# Patient Record
Sex: Female | Born: 1945 | Race: White | Hispanic: No | Marital: Married | Smoking: Never smoker
Health system: Southern US, Community
[De-identification: ages and names within clinical notes are randomized; demographics above are authoritative.]

## PROBLEM LIST (undated history)

## (undated) DIAGNOSIS — K5140 Inflammatory polyps of colon without complications: Secondary | ICD-10-CM

## (undated) DIAGNOSIS — C801 Malignant (primary) neoplasm, unspecified: Secondary | ICD-10-CM

## (undated) DIAGNOSIS — T7840XA Allergy, unspecified, initial encounter: Secondary | ICD-10-CM

## (undated) DIAGNOSIS — K5792 Diverticulitis of intestine, part unspecified, without perforation or abscess without bleeding: Secondary | ICD-10-CM

## (undated) DIAGNOSIS — M1990 Unspecified osteoarthritis, unspecified site: Secondary | ICD-10-CM

## (undated) DIAGNOSIS — R011 Cardiac murmur, unspecified: Secondary | ICD-10-CM

## (undated) DIAGNOSIS — K219 Gastro-esophageal reflux disease without esophagitis: Secondary | ICD-10-CM

## (undated) DIAGNOSIS — E785 Hyperlipidemia, unspecified: Secondary | ICD-10-CM

## (undated) HISTORY — DX: Hyperlipidemia, unspecified: E78.5

## (undated) HISTORY — DX: Gastro-esophageal reflux disease without esophagitis: K21.9

## (undated) HISTORY — DX: Unspecified osteoarthritis, unspecified site: M19.90

## (undated) HISTORY — DX: Inflammatory polyps of colon without complications: K51.40

## (undated) HISTORY — DX: Allergy, unspecified, initial encounter: T78.40XA

## (undated) HISTORY — DX: Cardiac murmur, unspecified: R01.1

## (undated) HISTORY — DX: Malignant (primary) neoplasm, unspecified: C80.1

## (undated) HISTORY — DX: Diverticulitis of intestine, part unspecified, without perforation or abscess without bleeding: K57.92

---

## 2015-09-23 LAB — HM MAMMOGRAPHY

## 2019-03-21 ENCOUNTER — Telehealth: Payer: Self-pay

## 2019-03-21 NOTE — Telephone Encounter (Signed)
Questions for Screening COVID-19  Symptom onset: n/a  Travel or Contacts:no  During this illness, did/does the patient experience any of the following symptoms? Fever >100.62F []   Yes [x]   No []   Unknown Subjective fever (felt feverish) []   Yes [x]   No []   Unknown Chills []   Yes [x]   No []   Unknown Muscle aches (myalgia) []   Yes [x]   No []   Unknown Runny nose (rhinorrhea) []   Yes [x]   No []   Unknown Sore throat []   Yes [x]   No []   Unknown Cough (new onset or worsening of chronic cough) []   Yes [x]   No []   Unknown Shortness of breath (dyspnea) []   Yes [x]   No []   Unknown Nausea or vomiting []   Yes [x]   No []   Unknown Headache []   Yes [x]   No []   Unknown Abdominal pain  []   Yes [x]   No []   Unknown Diarrhea (?3 loose/looser than normal stools/24hr period) []   Yes [x]   No []   Unknown Other, specify:  Patient risk factors: Smoker? []   Current []   Former []   Never If female, currently pregnant? []   Yes []   No  There are no active problems to display for this patient.   Plan:  []   High risk for COVID-19 with red flags go to ED (with CP, SOB, weak/lightheaded, or fever > 101.5). Call ahead.  []   High risk for COVID-19 but stable. Inform provider and coordinate time for [facility] visit.   []   No red flags but URI signs or symptoms okay for [facility] visit.

## 2019-03-22 ENCOUNTER — Ambulatory Visit (INDEPENDENT_AMBULATORY_CARE_PROVIDER_SITE_OTHER): Payer: Medicare Other | Admitting: Family Medicine

## 2019-03-22 ENCOUNTER — Encounter: Payer: Self-pay | Admitting: Family Medicine

## 2019-03-22 VITALS — BP 118/78 | HR 68 | Temp 98.0°F | Ht 63.5 in | Wt 149.4 lb

## 2019-03-22 DIAGNOSIS — R311 Benign essential microscopic hematuria: Secondary | ICD-10-CM | POA: Diagnosis not present

## 2019-03-22 DIAGNOSIS — Z1239 Encounter for other screening for malignant neoplasm of breast: Secondary | ICD-10-CM

## 2019-03-22 DIAGNOSIS — E7800 Pure hypercholesterolemia, unspecified: Secondary | ICD-10-CM

## 2019-03-22 DIAGNOSIS — Z7689 Persons encountering health services in other specified circumstances: Secondary | ICD-10-CM | POA: Diagnosis not present

## 2019-03-22 DIAGNOSIS — K219 Gastro-esophageal reflux disease without esophagitis: Secondary | ICD-10-CM | POA: Diagnosis not present

## 2019-03-22 DIAGNOSIS — Z1211 Encounter for screening for malignant neoplasm of colon: Secondary | ICD-10-CM

## 2019-03-22 NOTE — Progress Notes (Signed)
Sonya Benitez is a 73 y.o. female  Chief Complaint  Patient presents with  . Establish Care    est care/having some acid reflux and bloating (pt has hiatal hernia) pt states she can't even really drink water without it acting up    HPI: Sonya Benitez is a 73 y.o. female here to establish care with our office. She is married and her husband is patient of Dr. Zigmund Daniel. She has 2 grown daughters and 2 grandchildren. She moved to area from [place] from 3 years ago.   Pt notes a h/o hiatal hernia and reflux x years but complains of increased heartburn/reflux symptoms and associated bloating x 6-7 mo. Pt states sometimes even drinking water will trigger reflux. No dysphagia.  She took nexium 20mg  daily x 2 week and then another 2 weeks. She states this helped with her heartburn but not with the increased pressure/blaoting. She recently started "GI stability with Prebiotic 2"-FL" with each meal and feels this has helped with reflux more than nexium. She denies diarrhea or constipation.  She is due for colonoscopy.   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 She is established with derm here in [place].   Pt notes a h/o "blunted heart rate response" and "low filling volume" on tilt table test in 2016. Recommendation was to consider medication adjustment or pacemaker for HR and salt or florinef.   Specialists: Urology Lyda Perone, NP - [facility]), cardio (in [place], [place])  Last CPE, labs: 2016   Last PAP: n/a Last mammo: 5 years  Last colonoscopy: 2015 - due in 2020 Last eye exam: 2020  Diet: vegetarian   Past Medical History:  Diagnosis Date  . Allergy   . Arthritis   . Cancer ([place])    skin  . Diverticulitis   . GERD (gastroesophageal reflux disease)   . Heart murmur   . Hyperlipidemia   . 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 No pertinent surgical history.  Social History   Socioeconomic History  . Marital status: Married     Spouse name: Not on file  . Number of children: Not on file  . Years of education: Not on file  . Highest education level: Not on file  Occupational History  . Not on file  Social Needs  . Financial resource strain: Not on file  . Food insecurity    Worry: Not on file    Inability: Not on file  . Transportation needs    Medical: Not on file    Non-medical: Not on file  Tobacco Use  . Smoking status: Never Smoker  . Smokeless tobacco: Never Used  Substance and Sexual Activity  . Alcohol use: Never    Frequency: Never  . Drug use: Never  . Sexual activity: Not on file  Lifestyle  . Physical activity    Days per week: Not on file    Minutes per session: Not on file  . Stress: Not on file  Relationships  . Social Herbalist on phone: Not on file    Gets together: Not on file    Attends religious service: Not on file    Active member of club or organization: Not on file    Attends meetings of clubs or organizations: Not on file    Relationship status: Not on file  . Intimate partner violence    Fear of current or ex partner: Not on file  Emotionally abused: Not on file    Physically abused: Not on file    Forced sexual activity: Not on file  Other Topics Concern  . Not on file  Social History Narrative  . Not on file    Family History  Problem Relation Age of Onset  . Arthritis Mother   . Diabetes Mother   . Heart disease Mother   . Arthritis Father   . Hearing loss Father   . Heart disease Father   . Hyperlipidemia Father       There is no immunization history on file for this patient.  Outpatient Encounter Medications as of 03/22/2019  Medication Sig  . Alpha-D-Galactosidase (BEANO ULTRA 800 PO) Take by mouth.  Marland Kitchen azithromycin (ZITHROMAX) 250 MG tablet Take by mouth daily.  . cephALEXin (KEFLEX) 500 MG capsule TK 1 C PO BID  . esomeprazole (NEXIUM) 20 MG capsule Take 20 mg by mouth daily at 12 noon.  . methylPREDNISolone (MEDROL DOSEPAK) 4 MG  TBPK tablet FPD  . NON FORMULARY Estradiol/progesteron/testosterone Drops 150mg /3gm/50mg /50ml drop 1 drop 2 x daily  . UNABLE TO FIND DGL ultra  . UNABLE TO FIND Slippery elm  . UNABLE TO FIND Slippery Elm   No facility-administered encounter medications on file as of 03/22/2019.      ROS: Gen: no fever, chills  Skin: no rash, itching ENT: no ear pain, ear drainage, nasal congestion, rhinorrhea, sinus pressure, sore throat Eyes: no blurry vision, double vision Resp: no cough, wheeze,SOB CV: no CP, palpitations, LE edema,  GI: as above in HPI GU: no dysuria, urgency, frequency, hematuria  MSK: no joint pain, myalgias, back pain Neuro: no dizziness, headache, weakness, vertigo Psych: no depression, anxiety, insomnia   Allergies  Allergen Reactions  . Sudafed Pe Sinus Cong Day-Nght  [Diphenhydramine-Phenylephrine] Palpitations    BP 118/78   Pulse 68   Temp 98 F (36.7 C) (Oral)   Ht 5' 3.5" (1.613 m)   Wt 149 lb 6.4 oz (67.8 kg)   SpO2 97%   BMI 26.05 kg/m   Physical Exam  Constitutional: She is oriented to person, place, and time. She appears well-developed and well-nourished. No distress.  HENT:  Head: Normocephalic and atraumatic.  Right Ear: Tympanic membrane and ear canal normal.  Left Ear: Tympanic membrane and ear canal normal.  Nose: Nose normal.  Mouth/Throat: Oropharynx is clear and moist and mucous membranes are normal.  Eyes: Pupils are equal, round, and reactive to light. Conjunctivae are normal.  Neck: Neck supple. No thyromegaly present.  Cardiovascular: Normal rate, regular rhythm, normal heart sounds and intact distal pulses.  No murmur heard. Pulmonary/Chest: Effort normal and breath sounds normal. No respiratory distress. She has no wheezes. She has no rhonchi.  Abdominal: Soft. Bowel sounds are normal. She exhibits no distension and no mass. There is no abdominal tenderness.  Musculoskeletal:        General: No edema.  Lymphadenopathy:    She  has no cervical adenopathy.  Neurological: She is alert and oriented to person, place, and time. She exhibits normal muscle tone. Coordination normal.  Skin: Skin is warm and dry.  Psychiatric: She has a normal mood and affect. Her behavior is normal.     A/P:  1. Encounter to establish care with new doctor - pt will schedule CPE, fasting labs  2. Benign microscopic hematuria - worked up in the past, negative w/u  3. Hypercholesterolemia - not on meds - will check FLP at CPE appt  4.  Gastroesophageal reflux disease, esophagitis presence not specified - chronic, ongoing x years - pt also with hiatal hernia - symptoms worse in past 6-37mo - Ambulatory referral to Gastroenterology  5. Screening for breast cancer 6. Screening for colon cancer - MM DIGITAL SCREENING BILATERAL; Future

## 2019-04-04 ENCOUNTER — Ambulatory Visit: Payer: Medicare Other | Admitting: Family Medicine

## 2019-04-05 ENCOUNTER — Telehealth: Payer: Self-pay | Admitting: Family Medicine

## 2019-04-05 NOTE — Telephone Encounter (Signed)

## 2019-04-06 ENCOUNTER — Ambulatory Visit: Payer: Medicare Other | Admitting: Family Medicine

## 2019-04-11 ENCOUNTER — Encounter: Payer: Self-pay | Admitting: Gastroenterology

## 2019-04-11 ENCOUNTER — Other Ambulatory Visit: Payer: Self-pay

## 2019-04-11 ENCOUNTER — Telehealth (INDEPENDENT_AMBULATORY_CARE_PROVIDER_SITE_OTHER): Payer: Medicare Other | Admitting: Gastroenterology

## 2019-04-11 VITALS — Ht 63.5 in | Wt 148.0 lb

## 2019-04-11 DIAGNOSIS — K449 Diaphragmatic hernia without obstruction or gangrene: Secondary | ICD-10-CM

## 2019-04-11 DIAGNOSIS — K219 Gastro-esophageal reflux disease without esophagitis: Secondary | ICD-10-CM

## 2019-04-11 DIAGNOSIS — R109 Unspecified abdominal pain: Secondary | ICD-10-CM

## 2019-04-11 DIAGNOSIS — R140 Abdominal distension (gaseous): Secondary | ICD-10-CM | POA: Diagnosis not present

## 2019-04-11 DIAGNOSIS — K5730 Diverticulosis of large intestine without perforation or abscess without bleeding: Secondary | ICD-10-CM

## 2019-04-11 DIAGNOSIS — Z86010 Personal history of colonic polyps: Secondary | ICD-10-CM

## 2019-04-11 NOTE — Patient Instructions (Signed)
If you are age 72 or older, your body mass index should be between 23-30. Your Body mass index is 25.81 kg/m. If this is out of the aforementioned range listed, please consider follow up with your Primary Care Provider.  If you are age 60 or younger, your body mass index should be between 19-25. Your Body mass index is 25.81 kg/m. If this is out of the aformentioned range listed, please consider follow up with your Primary Care Provider.   To help prevent the possible spread of infection to our patients, communities, and staff; we will be implementing the following measures:  As of now we are not allowing any visitors/family members to accompany you to any upcoming appointments with [facility]. If you have any concerns about this please contact our office to discuss prior to the appointment.   We have sent the following medications to your pharmacy for you to pick up at your convenience: [place] have been scheduled for an endoscopy and colonoscopy. Please follow the written instructions given to you at your visit today. Please pick up your prep supplies at the pharmacy within the next 1-3 days. If you use inhalers (even only as needed), please bring them with you on the day of your procedure. Your physician has requested that you go to www.startemmi.com and enter the access code given to you at your visit today. This web site gives a general overview about your procedure. However, you should still follow specific instructions given to you by our office regarding your preparation for the procedure.  It was a pleasure to see you today!  Vito Cirigliano, D.O.

## 2019-04-11 NOTE — Progress Notes (Signed)
Chief Complaint: GERD, Colon polyps, Abdominal bloating  Referring Provider:     Ronnald Nian, DO   HPI:    Due to current restrictions/limitations of in-office visits due to the COVID-19 pandemic, this scheduled clinic appointment was converted to a telehealth virtual consultation using D[employer]im[employer].  -Time: 24 minutes -The patient did consent to this virtual visit and is aware of possible charges through their insurance for this visit.  -Names of all parties present: Sonya Benitez (patient), Gerrit Heck, DO, [facility] (physician) -Patient location: Home -Physician location: Office  Sonya Benitez is a 73 y.o. female referred to the Gastroenterology Clinic for evaluation of GERD and known history of hiatal hernia.  Has had reflux for years, characterized as heartburn and regurgitation, treated with multiple acid suppression meds in the past (Protonix, Nexium, Prilosec, Tums), which she eventually stopped due to variable efficacy along with not wanting to take PPI long-term. Had controlled with dietary mods and OTC/supplements/herbals for 5 years, but with breakthrough in the last 6-7 months.  No dysphagia.  No early satiety, weight loss, fever, chills.  No hematochezia or melena.  Has additionally noted abdominal bloating/abdominal pressure for the last 6 to 7 months.  She recently trialed a course of Nexium 20 mg/day x4 weeks, with some improvement in her heartburn and no change in abdominal pressure/bloating.  Has since started a prebiotic with each meal and feel this has helped the reflux more than the Nexium did and also vastly improved her abdominal pressure/bloating. Will also take [facility] for prn use, with quick relief of reflux sxs.   Has also had Abx courses x3 this year which has affected her UGI sxs, in particular bloating.   Has a known hx of HH diagnosed by her previous GI in [place], [place], with last EGD years ago. No reports available for review.     Last colonoscopy was 5 years ago and has a hx of diverticulosis and polyps, with recommendation to repeat in 5 years for ongoing surveillance.  She is otherwise without LGI symptoms.  No recent changes in bowel habits.  Past medical history, past surgical history, social history, family history, medications, and allergies reviewed in the chart and with patient.    Past Medical History:  Diagnosis Date  . Allergy   . Arthritis   . Cancer ([place])    skin  . Diverticulitis   . GERD (gastroesophageal reflux disease)   . Heart murmur   . Hyperlipidemia   . 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 No pertinent surgical history. Family History  Problem Relation Age of Onset  . Arthritis Mother   . Diabetes Mother   . Heart disease Mother   . Arthritis Father   . Hearing loss Father   . Heart disease Father   . Hyperlipidemia Father   . Colon cancer Neg Hx    Social History   Tobacco Use  . Smoking status: Never Smoker  . Smokeless tobacco: Never Used  Substance Use Topics  . Alcohol use: Never    Frequency: Never  . Drug use: Never   Current Outpatient Medications  Medication Sig Dispense Refill  . Alpha-D-Galactosidase (BEANO ULTRA 800 PO) Take by mouth as needed.     . NON FORMULARY Estradiol/progesteron/testosterone Drops 150mg /3gm/50mg /62ml drop 1 drop 2 x daily    . Probiotic Product (PROBIOTIC PO) Take by mouth daily.    Marland Kitchen UNABLE  TO FIND as needed. DGL ultra     . UNABLE TO FIND as needed. Slippery elm      No current facility-administered medications for this visit.    Allergies  Allergen Reactions  . Sudafed Pe Sinus Cong Day-Nght  [Diphenhydramine-Phenylephrine] Palpitations     Review of Systems: All systems reviewed and negative except where noted in HPI.     Physical Exam:    Complete physical exam not completed due to the nature of this telehealth communication.   Gen: Awake, alert, and oriented, and well communicative. HEENT:  EOMI, non-icteric sclera, NCAT, MMM Neck: Normal movement of head and neck Pulm: No labored breathing, speaking in full sentences without conversational dyspnea Derm: No apparent lesions or bruising in visible field MS: Moves all visible extremities without noticeable abnormality Psych: Pleasant, cooperative, normal speech, thought processing seemingly intact   ASSESSMENT AND PLAN;   1) GERD 2) Hiatal hernia -Recent breakthrough and index reflux symptoms  - Evaluate for erosive esophagitis, size of hiatal hernia, LES laxity, etc. time of EGD -Resume antireflux lifestyle/dietary modifications  3) Abdominal bloating 4) Abdominal pressure - EGD to evaluate for mucosal/luminal etiology -Gastric and duodenal biopsies -Suspect some element of SIBO related to her 3 courses of antibiotics earlier this year.  Symptoms improving with trial of prebiotic, therefore holding off on adding trial of Xifaxan or other enteric antibiotics.  If symptoms not fully resolved, can consider breath testing  5) History of colon polyps -Last colonoscopy was >5 years ago and notable for polyps per patient, with recommendation to repeat in 5 years for ongoing surveillance.  No records available for review from last GI and [place]  6) History of diverticulosis  The indications, risks, and benefits of EGD and colonoscopy were explained to the patient in detail. Risks include but are not limited to bleeding, perforation, adverse reaction to medications, and cardiopulmonary compromise. Sequelae include but are not limited to the possibility of surgery, hositalization, and mortality. The patient verbalized understanding and wished to proceed. All questions answered, referred to scheduler and bowel prep ordered. Further recommendations pending results of the exam.     Lavena Bullion, DO, FACG  04/11/2019, 9:04 AM   Ronnald Nian, DO

## 2019-04-12 ENCOUNTER — Other Ambulatory Visit: Payer: Self-pay

## 2019-04-12 DIAGNOSIS — K5730 Diverticulosis of large intestine without perforation or abscess without bleeding: Secondary | ICD-10-CM

## 2019-04-12 DIAGNOSIS — R140 Abdominal distension (gaseous): Secondary | ICD-10-CM

## 2019-04-12 DIAGNOSIS — R109 Unspecified abdominal pain: Secondary | ICD-10-CM

## 2019-04-12 DIAGNOSIS — K219 Gastro-esophageal reflux disease without esophagitis: Secondary | ICD-10-CM

## 2019-04-12 DIAGNOSIS — Z86010 Personal history of colonic polyps: Secondary | ICD-10-CM

## 2019-04-12 DIAGNOSIS — K449 Diaphragmatic hernia without obstruction or gangrene: Secondary | ICD-10-CM

## 2019-04-12 MED ORDER — NA SULFATE-K SULFATE-MG SULF 17.5-3.13-1.6 GM/177ML PO SOLN: 1.0000 | Freq: Once | ORAL | 0 refills | Status: AC

## 2019-04-17 ENCOUNTER — Telehealth: Payer: Self-pay | Admitting: Gastroenterology

## 2019-04-17 NOTE — Telephone Encounter (Signed)
Left message to call back to ask Covid-19 screening questions. Covid-19 Screening Questions:   Do you now or have you had a fever in the last 14 days? no   Do you have any respiratory symptoms of shortness of breath or cough now or in the last 14 days? no   Do you have any family members or close contacts with diagnosed  or suspected Covid-19 in the past 14 days? no   Have you been tested for Covid-19 and found to be positive? no   Pt made aware of that care partner may wait in the car or come up to the lobby during the procedure but will need to provide their own mask.

## 2019-04-18 ENCOUNTER — Ambulatory Visit (AMBULATORY_SURGERY_CENTER): Payer: Medicare Other | Admitting: Gastroenterology

## 2019-04-18 ENCOUNTER — Other Ambulatory Visit: Payer: Self-pay

## 2019-04-18 ENCOUNTER — Encounter: Payer: Self-pay | Admitting: Gastroenterology

## 2019-04-18 VITALS — BP 110/60 | HR 66 | Temp 97.7°F | Resp 14 | Ht 63.5 in | Wt 148.0 lb

## 2019-04-18 DIAGNOSIS — K317 Polyp of stomach and duodenum: Secondary | ICD-10-CM | POA: Diagnosis not present

## 2019-04-18 DIAGNOSIS — K635 Polyp of colon: Secondary | ICD-10-CM

## 2019-04-18 DIAGNOSIS — D122 Benign neoplasm of ascending colon: Secondary | ICD-10-CM

## 2019-04-18 DIAGNOSIS — K219 Gastro-esophageal reflux disease without esophagitis: Secondary | ICD-10-CM

## 2019-04-18 DIAGNOSIS — Z86010 Personal history of colonic polyps: Secondary | ICD-10-CM

## 2019-04-18 DIAGNOSIS — R140 Abdominal distension (gaseous): Secondary | ICD-10-CM

## 2019-04-18 DIAGNOSIS — R109 Unspecified abdominal pain: Secondary | ICD-10-CM

## 2019-04-18 DIAGNOSIS — K2970 Gastritis, unspecified, without bleeding: Secondary | ICD-10-CM | POA: Diagnosis not present

## 2019-04-18 DIAGNOSIS — D120 Benign neoplasm of cecum: Secondary | ICD-10-CM

## 2019-04-18 DIAGNOSIS — K640 First degree hemorrhoids: Secondary | ICD-10-CM

## 2019-04-18 DIAGNOSIS — K5730 Diverticulosis of large intestine without perforation or abscess without bleeding: Secondary | ICD-10-CM

## 2019-04-18 MED ORDER — SODIUM CHLORIDE 0.9 % IV SOLN: 500.0000 mL | Freq: Once | INTRAVENOUS | Status: DC

## 2019-04-18 NOTE — Op Note (Signed)
[place] Patient Name: Sonya Benitez Procedure Date: 04/18/2019 7:47 AM MRN: [id number] Endoscopist: Gerrit Heck , MD Age: 73 Referring MD:  Date of Birth: 07-27-1946 Gender: Female Account #: [id number] Procedure:                Upper GI endoscopy Indications:              Suspected esophageal reflux, Exclusion of reflux                            esophagitis, Abdominal bloating                           73 yo female with a long-standing history of                            reflux, with recent breakthrough in index symptoms                            of heartburn and regurgitation, along with                            abdominal bloating and upper abdominal                            discomfort/pressure. Medicines:                Monitored Anesthesia Care Procedure:                Pre-Anesthesia Assessment:                           - Prior to the procedure, a History and Physical                            was performed, and patient medications and                            allergies were reviewed. The patient's tolerance of                            previous anesthesia was also reviewed. The risks                            and benefits of the procedure and the sedation                            options and risks were discussed with the patient.                            All questions were answered, and informed consent                            was obtained. Prior Anticoagulants: The patient has  taken no previous anticoagulant or antiplatelet                            agents. ASA Grade Assessment: II - A patient with                            mild systemic disease. After reviewing the risks                            and benefits, the patient was deemed in                            satisfactory condition to undergo the procedure.                           After obtaining informed consent, the endoscope was                             passed under direct vision. Throughout the                            procedure, the patient's blood pressure, pulse, and                            oxygen saturations were monitored continuously. The                            Endoscope was introduced through the mouth, and                            advanced to the second part of duodenum. The upper                            GI endoscopy was accomplished without difficulty.                            The patient tolerated the procedure well. Scope In: Scope Out: Findings:                 The examined esophagus was normal.                           The Z-line was regular and was found 36 cm from the                            incisors.                           Esophagogastric landmarks were identified: the                            Z-line was found at 36 cm, the gastroesophageal                            junction was found  at 36 cm and the site of hiatal                            narrowing was found at 36 cm from the incisors.                           The gastroesophageal flap valve was visualized                            endoscopically and classified as Hill Grade I                            (prominent fold, tight to endoscope).                           A few small sessile polyps with no bleeding and no                            stigmata of recent bleeding were found in the                            gastric fundus and in the gastric body. These                            polyps were removed with a cold biopsy forceps.                            Resection and retrieval were complete. Estimated                            blood loss was minimal.                           Normal mucosa was found in the entire examined                            stomach. Biopsies were taken with a cold forceps                            for Helicobacter pylori testing. Estimated blood                            loss was minimal.                            The duodenal bulb, first portion of the duodenum                            and second portion of the duodenum were normal.                            Biopsies for histology were taken with a cold  forceps for evaluation of celiac disease. Estimated                            blood loss was minimal. Complications:            No immediate complications. Estimated Blood Loss:     Estimated blood loss was minimal. Impression:               - Normal esophagus.                           - Z-line regular, 36 cm from the incisors.                           - Esophagogastric landmarks identified.                           - Gastroesophageal flap valve classified as Hill                            Grade I (prominent fold, tight to endoscope).                           - A few gastric polyps. Resected and retrieved.                           - Normal mucosa was found in the entire stomach.                            Biopsied.                           - Normal duodenal bulb, first portion of the                            duodenum and second portion of the duodenum.                            Biopsied. Recommendation:           - Patient has a contact number available for                            emergencies. The signs and symptoms of potential                            delayed complications were discussed with the                            patient. Return to normal activities tomorrow.                            Written discharge instructions were provided to the                            patient.                           -  Resume previous diet.                           - Continue present medications.                           - Await pathology results.                           - Return to GI clinic PRN.                           - If ongoing symptoms of bloating, may consider                            hydrogen breath testing vs empiric trial of                             antimicrobial therapy for possible                            antibiotic-induced Small Intestinal Bacterial                            Overgrowth (SIBO). Gerrit Heck, MD 04/18/2019 8:25:45 AM

## 2019-04-18 NOTE — Progress Notes (Signed)
Report to PACU, RN, vss, BBS= Clear.  

## 2019-04-18 NOTE — Progress Notes (Signed)
Temp taken by JB VS taken by MO

## 2019-04-18 NOTE — Op Note (Signed)
[place] Patient Name: Sonya Benitez Procedure Date: 04/18/2019 7:46 AM MRN: [id number] Endoscopist: Gerrit Heck , MD Age: 73 Referring MD:  Date of Birth: 27-Aug-1946 Gender: Female Account #: [id number] Procedure:                Colonoscopy Indications:              Surveillance: Personal history of colonic polyps                            (unknown histology) on last colonoscopy 5 years ago                            performed at outside facility, with recommendation                            to repeat in 5 years for ongoing surveillance.                            Otherwise, no active lower GI symptoms. Medicines:                Monitored Anesthesia Care Procedure:                Pre-Anesthesia Assessment:                           - Prior to the procedure, a History and Physical                            was performed, and patient medications and                            allergies were reviewed. The patient's tolerance of                            previous anesthesia was also reviewed. The risks                            and benefits of the procedure and the sedation                            options and risks were discussed with the patient.                            All questions were answered, and informed consent                            was obtained. Prior Anticoagulants: The patient has                            taken no previous anticoagulant or antiplatelet                            agents. ASA Grade Assessment: II - A patient with  mild systemic disease. After reviewing the risks                            and benefits, the patient was deemed in                            satisfactory condition to undergo the procedure.                           After obtaining informed consent, the colonoscope                            was passed under direct vision. Throughout the                            procedure, the  patient's blood pressure, pulse, and                            oxygen saturations were monitored continuously. The                            Colonoscope was introduced through the anus and                            advanced to the the cecum, identified by                            appendiceal orifice and ileocecal valve. The                            colonoscopy was performed without difficulty. The                            patient tolerated the procedure well. The quality                            of the bowel preparation was adequate. The                            ileocecal valve, appendiceal orifice, and rectum                            were photographed. Scope In: 8:01:58 AM Scope Out: 8:17:08 AM Scope Withdrawal Time: 0 hours 13 minutes 2 seconds  Total Procedure Duration: 0 hours 15 minutes 10 seconds  Findings:                 Skin tags were found on perianal exam.                           Two sessile polyps were found in the ascending                            colon and cecum. The polyps were 6 to 8 mm in size.  These polyps were removed with a cold snare.                            Resection and retrieval were complete. Estimated                            blood loss was minimal.                           Multiple small and large-mouthed diverticula were                            found in the sigmoid colon, descending colon,                            transverse colon and ascending colon.                           Non-bleeding internal hemorrhoids were found during                            retroflexion. The hemorrhoids were small. Complications:            No immediate complications. Estimated Blood Loss:     Estimated blood loss was minimal. Impression:               - Perianal skin tags found on perianal exam.                           - Two 6 to 8 mm polyps in the ascending colon and                            in the cecum, removed  with a cold snare. Resected                            and retrieved.                           - Diverticulosis in the sigmoid colon, in the                            descending colon, in the transverse colon and in                            the ascending colon.                           - Non-bleeding internal hemorrhoids. Recommendation:           - Patient has a contact number available for                            emergencies. The signs and symptoms of potential                            delayed complications were  discussed with the                            patient. Return to normal activities tomorrow.                            Written discharge instructions were provided to the                            patient.                           - Resume previous diet.                           - Continue present medications.                           - Await pathology results.                           - Repeat colonoscopy in 5 years for surveillance                            based on pathology results.                           - Return to GI clinic PRN.                           - Use fiber, for example Citrucel, Fibercon, Konsyl                            or Metamucil.                           - Internal hemorrhoids were noted on this study and                            may be amenable to hemorrhoid band ligation. If you                            are interested in further treatment of these                            hemorrhoids with band ligation, please contact my                            clinic to set up an appointment for evaluation and                            treatment. Gerrit Heck, MD 04/18/2019 8:30:34 AM

## 2019-04-18 NOTE — Patient Instructions (Signed)
Please read handouts provided. Await pathology results. Return to GI clinic as needed. Use fiber, for example Citrucel, Fibercon, Konsyl or Metamucil. Continue present medications.       YOU HAD AN ENDOSCOPIC PROCEDURE TODAY AT [facility]:   Refer to the procedure report that was given to you for any specific questions about what was found during the examination.  If the procedure report does not answer your questions, please call your gastroenterologist to clarify.  If you requested that your care partner not be given the details of your procedure findings, then the procedure report has been included in a sealed envelope for you to review at your convenience later.  YOU SHOULD EXPECT: Some feelings of bloating in the abdomen. Passage of more gas than usual.  Walking can help get rid of the air that was put into your GI tract during the procedure and reduce the bloating. If you had a lower endoscopy (such as a colonoscopy or flexible sigmoidoscopy) you may notice spotting of blood in your stool or on the toilet paper. If you underwent a bowel prep for your procedure, you may not have a normal bowel movement for a few days.  Please Note:  You might notice some irritation and congestion in your nose or some drainage.  This is from the oxygen used during your procedure.  There is no need for concern and it should clear up in a day or so.  SYMPTOMS TO REPORT IMMEDIATELY:   Following lower endoscopy (colonoscopy or flexible sigmoidoscopy):  Excessive amounts of blood in the stool  Significant tenderness or worsening of abdominal pains  Swelling of the abdomen that is new, acute  Fever of 100F or higher   Following upper endoscopy (EGD)  Vomiting of blood or coffee ground material  New chest pain or pain under the shoulder blades  Painful or persistently difficult swallowing  New shortness of breath  Fever of 100F or higher  Black, tarry-looking stools  For urgent or  emergent issues, a gastroenterologist can be reached at any hour by calling [phone].   DIET:  We do recommend a small meal at first, but then you may proceed to your regular diet.  Drink plenty of fluids but you should avoid alcoholic beverages for 24 hours.  ACTIVITY:  You should plan to take it easy for the rest of today and you should NOT DRIVE or use heavy machinery until tomorrow (because of the sedation medicines used during the test).    FOLLOW UP: Our staff will call the number listed on your records 48-72 hours following your procedure to check on you and address any questions or concerns that you may have regarding the information given to you following your procedure. If we do not reach you, we will leave a message.  We will attempt to reach you two times.  During this call, we will ask if you have developed any symptoms of COVID 19. If you develop any symptoms (ie: fever, flu-like symptoms, shortness of breath, cough etc.) before then, please call [phone].  If you test positive for Covid 19 in the 2 weeks post procedure, please call and report this information to [place].    If any biopsies were taken you will be contacted by phone or by letter within the next 1-3 weeks.  Please call us at [phone] if you have not heard about the biopsies in 3 weeks.    SIGNATURES/CONFIDENTIALITY: You and/or your care partner have signed paperwork  which will be entered into your electronic medical record.  These signatures attest to the fact that that the information above on your After Visit Summary has been reviewed and is understood.  Full responsibility of the confidentiality of this discharge information lies with you and/or your care-partner.

## 2019-04-19 ENCOUNTER — Telehealth: Payer: Self-pay

## 2019-04-19 NOTE — Telephone Encounter (Signed)
Questions for Screening COVID-19  Symptom onset: n/a  Travel or Contacts: no  During this illness, did/does the patient experience any of the following symptoms? Fever >100.36F []   Yes [x]   No []   Unknown Subjective fever (felt feverish) []   Yes [x]   No []   Unknown Chills []   Yes [x]   No []   Unknown Muscle aches (myalgia) []   Yes [x]   No []   Unknown Runny nose (rhinorrhea) []   Yes [x]   No []   Unknown Sore throat []   Yes [x]   No []   Unknown Cough (new onset or worsening of chronic cough) []   Yes [x]   No []   Unknown Shortness of breath (dyspnea) []   Yes [x]   No []   Unknown Nausea or vomiting []   Yes [x]   No []   Unknown Headache []   Yes [x]   No []   Unknown Abdominal pain  []   Yes [x]   No []   Unknown Diarrhea (?3 loose/looser than normal stools/24hr period) []   Yes [x]   No []   Unknown Other, specify:  Patient risk factors: Smoker? []   Current []   Former []   Never If female, currently pregnant? []   Yes []   No  Patient Active Problem List   Diagnosis Date Noted  . Benign microscopic hematuria 03/22/2019    Plan:  []   High risk for COVID-19 with red flags go to ED (with CP, SOB, weak/lightheaded, or fever > 101.5). Call ahead.  []   High risk for COVID-19 but stable. Inform provider and coordinate time for [facility] visit.   []   No red flags but URI signs or symptoms okay for [facility] visit.

## 2019-04-20 ENCOUNTER — Telehealth: Payer: Self-pay

## 2019-04-20 ENCOUNTER — Ambulatory Visit (INDEPENDENT_AMBULATORY_CARE_PROVIDER_SITE_OTHER): Payer: Medicare Other | Admitting: Family Medicine

## 2019-04-20 ENCOUNTER — Encounter: Payer: Self-pay | Admitting: Family Medicine

## 2019-04-20 VITALS — BP 122/82 | HR 74 | Temp 97.9°F | Ht 63.5 in | Wt 148.0 lb

## 2019-04-20 DIAGNOSIS — Z85828 Personal history of other malignant neoplasm of skin: Secondary | ICD-10-CM

## 2019-04-20 DIAGNOSIS — Z0000 Encounter for general adult medical examination without abnormal findings: Secondary | ICD-10-CM | POA: Diagnosis not present

## 2019-04-20 DIAGNOSIS — Z9889 Other specified postprocedural states: Secondary | ICD-10-CM

## 2019-04-20 DIAGNOSIS — G479 Sleep disorder, unspecified: Secondary | ICD-10-CM | POA: Diagnosis not present

## 2019-04-20 DIAGNOSIS — Z2821 Immunization not carried out because of patient refusal: Secondary | ICD-10-CM

## 2019-04-20 LAB — URINALYSIS, ROUTINE W REFLEX MICROSCOPIC
Bilirubin Urine: NEGATIVE
Ketones, ur: NEGATIVE
Leukocytes,Ua: NEGATIVE
Nitrite: NEGATIVE
Specific Gravity, Urine: <=1.005 — AB (ref 1.000–1.030)
Total Protein, Urine: NEGATIVE
Urine Glucose: NEGATIVE
Urobilinogen, UA: 0.2 (ref 0.0–1.0)
WBC, UA: NONE SEEN (ref 0–?)
pH: 6 (ref 5.0–8.0)

## 2019-04-20 LAB — BASIC METABOLIC PANEL
BUN: 9 mg/dL (ref 6–23)
CO2: 27 mEq/L (ref 19–32)
Calcium: 9.2 mg/dL (ref 8.4–10.5)
Chloride: 102 mEq/L (ref 96–112)
Creatinine, Ser: 0.7 mg/dL (ref 0.40–1.20)
GFR: 82.13 mL/min (ref >60.00)
Glucose, Bld: 102 mg/dL — ABNORMAL HIGH (ref 70–99)
Potassium: 3.6 mEq/L (ref 3.5–5.1)
Sodium: 138 mEq/L (ref 135–145)

## 2019-04-20 LAB — LIPID PANEL
Cholesterol: 242 mg/dL — ABNORMAL HIGH (ref 0–200)
HDL: 49 mg/dL (ref >39.00)
LDL Cholesterol: 164 mg/dL — ABNORMAL HIGH (ref 0–99)
NonHDL: 192.64
Total CHOL/HDL Ratio: 5
Triglycerides: 143 mg/dL (ref 0.0–149.0)
VLDL: 28.6 mg/dL (ref 0.0–40.0)

## 2019-04-20 LAB — AST: AST: 18 U/L (ref 0–37)

## 2019-04-20 LAB — ALT: ALT: 11 U/L (ref 0–35)

## 2019-04-20 NOTE — Telephone Encounter (Signed)
  Follow up Call-  Call back number 04/18/2019  Post procedure Call Back phone  # [phone]  Permission to leave phone message Yes     Patient questions:  Do you have a fever, pain , or abdominal swelling? No. Pain Score  0 *  Have you tolerated food without any problems? Yes.    Have you been able to return to your normal activities? Yes.    Do you have any questions about your discharge instructions: Diet   No. Medications  No. Follow up visit  No.  Do you have questions or concerns about your Care? No.  Actions: * If pain score is 4 or above: No action needed, pain <4.  1. Have you developed a fever since your procedure? no  2.   Have you had an respiratory symptoms (SOB or cough) since your procedure? no  3.   Have you tested positive for COVID 19 since your procedure no  4.   Have you had any family members/close contacts diagnosed with the COVID 19 since your procedure?  no   If yes to any of these questions please route to Joylene John, RN and Alphonsa Gin, Therapist, sports.

## 2019-04-20 NOTE — Patient Instructions (Addendum)
Melatonin 5mg  30-25min prior to bedtime x 4-6 wks Can also try long acting melatonin   Zoster Vaccine, Recombinant injection What is this medicine? ZOSTER VACCINE (ZOS ter vak SEEN) is used to prevent shingles in adults 72 years old and over. This vaccine is not used to treat shingles or nerve pain from shingles. This medicine may be used for other purposes; ask your health care provider or pharmacist if you have questions. COMMON BRAND NAME(S): [facility] What should I tell my health care provider before I take this medicine? They need to know if you have any of these conditions:  blood disorders or disease  cancer like leukemia or lymphoma  immune system problems or therapy  an unusual or allergic reaction to vaccines, other medications, foods, dyes, or preservatives  pregnant or trying to get pregnant  breast-feeding How should I use this medicine? This vaccine is for injection in a muscle. It is given by a health care professional. Talk to your pediatrician regarding the use of this medicine in children. This medicine is not approved for use in children. Overdosage: If you think you have taken too much of this medicine contact a poison control center or emergency room at once. NOTE: This medicine is only for you. Do not share this medicine with others. What if I miss a dose? Keep appointments for follow-up (booster) doses as directed. It is important not to miss your dose. Call your doctor or health care professional if you are unable to keep an appointment. What may interact with this medicine?  medicines that suppress your immune system  medicines to treat cancer  steroid medicines like prednisone or cortisone This list may not describe all possible interactions. Give your health care provider a list of all the medicines, herbs, non-prescription drugs, or dietary supplements you use. Also tell them if you smoke, drink alcohol, or use illegal drugs. Some items may interact with  your medicine. What should I watch for while using this medicine? Visit your doctor for regular check ups. This vaccine, like all vaccines, may not fully protect everyone. What side effects may I notice from receiving this medicine? Side effects that you should report to your doctor or health care professional as soon as possible:  allergic reactions like skin rash, itching or hives, swelling of the face, lips, or tongue  breathing problems Side effects that usually do not require medical attention (report these to your doctor or health care professional if they continue or are bothersome):  chills  headache  fever  nausea, vomiting  redness, warmth, pain, swelling or itching at site where injected  tiredness This list may not describe all possible side effects. Call your doctor for medical advice about side effects. You may report side effects to FDA at 1-800-FDA-1088. Where should I keep my medicine? This vaccine is only given in a clinic, pharmacy, doctor's office, or other health care setting and will not be stored at home. NOTE: This sheet is a summary. It may not cover all possible information. If you have questions about this medicine, talk to your doctor, pharmacist, or health care provider.  2020 Elsevier/Gold Standard (2017-04-11 13:20:30)

## 2019-04-20 NOTE — Telephone Encounter (Signed)
Attempted to reach patient for post-procedure f/u call. No answer. Unable to leave message. Staff will make another atttempt to reach her again later today.

## 2019-04-20 NOTE — Progress Notes (Addendum)
Sonya Benitez is a 73 y.o. female  Chief Complaint  Patient presents with  . Annual Exam    CPE- Fasting/ denies TDAP/ doesn't want immunizations/ dexa scan 6 years?    HPI: Sonya Benitez is a 73 y.o. female here for derm referral, concern about sleep, general physical, fasting labs today. She has mammo scheduled, she is UTD on colonoscopy/EGD, declines Dexa (last done 5-6 years ago and normal). She declines Tdap, pneumonia vaccine. She will consider shingrix vaccine.   She is having trouble sleeping, some times falling asleep and other times staying asleep. She has not tried any OTC or Rx meds for this. She does have some nights where she sleeps relatively well. Very limited caffeine. Denies anxiety.   Pt would like to establish with new derm. She has h/o basal and squamous cell.  Last mammo: scheduled Last Dexa: declines Last colonoscopy: 04/2019  Diet/Exercise: vegetarian, eats cheese, drinks plenty of water  Med refills needed today? none  Past Medical History:  Diagnosis Date  . Allergy   . Arthritis   . Cancer ([place])    skin  . Diverticulitis   . GERD (gastroesophageal reflux disease)   . Heart murmur   . Hyperlipidemia   . 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 No pertinent surgical history.  Social History   Socioeconomic History  . Marital status: Married    Spouse name: Not on file  . Number of children: Not on file  . Years of education: Not on file  . Highest education level: Not on file  Occupational History  . Not on file  Social Needs  . Financial resource strain: Not on file  . Food insecurity    Worry: Not on file    Inability: Not on file  . Transportation needs    Medical: Not on file    Non-medical: Not on file  Tobacco Use  . Smoking status: Never Smoker  . Smokeless tobacco: Never Used  Substance and Sexual Activity  . Alcohol use: Never    Frequency: Never  . Drug use: Never  . Sexual activity: Not on file   Lifestyle  . Physical activity    Days per week: Not on file    Minutes per session: Not on file  . Stress: Not on file  Relationships  . Social Herbalist on phone: Not on file    Gets together: Not on file    Attends religious service: Not on file    Active member of club or organization: Not on file    Attends meetings of clubs or organizations: Not on file    Relationship status: Not on file  . Intimate partner violence    Fear of current or ex partner: Not on file    Emotionally abused: Not on file    Physically abused: Not on file    Forced sexual activity: Not on file  Other Topics Concern  . Not on file  Social History Narrative  . Not on file    Family History  Problem Relation Age of Onset  . Arthritis Mother   . Diabetes Mother   . Heart disease Mother   . Arthritis Father   . Hearing loss Father   . Heart disease Father   . Hyperlipidemia Father   . Colon cancer Neg Hx   . Stomach cancer Neg Hx   . Rectal cancer Neg Hx   . Esophageal cancer Neg Hx  There is no immunization history on file for this patient.  Outpatient Encounter Medications as of 04/20/2019  Medication Sig  . Alpha-D-Galactosidase (BEANO ULTRA 800 PO) Take by mouth as needed.   . NON FORMULARY Estradiol/progesteron/testosterone Drops 150mg /3gm/50mg /16ml drop 1 drop 2 x daily  . Probiotic Product (PROBIOTIC PO) Take by mouth daily.  . psyllium (METAMUCIL) 58.6 % packet Take 1 packet by mouth daily.  Marland Kitchen UNABLE TO FIND as needed. DGL ultra   . UNABLE TO FIND as needed. Slippery elm    No facility-administered encounter medications on file as of 04/20/2019.      ROS: Gen: no fever, chills  Skin: no rash, itching ENT: no ear pain, ear drainage, nasal congestion, rhinorrhea, sinus pressure, sore throat Eyes: no blurry vision, double vision Resp: no cough, wheeze,SOB Breast: no breast tenderness, no nipple discharge, no breast masses CV: no CP, palpitations, LE edema,   GI: + heartburn, no n/v/d/c, abd pain' + bloating GU: no dysuria, urgency, frequency, hematuria; no vaginal itching, odor, discharge MSK: occasional joint pains, mostly in knees, no myalgias, back pain Neuro: no dizziness, headache, weakness, vertigo Psych: no depression, anxiety; + trouble sleeping   Allergies  Allergen Reactions  . Sudafed Pe Sinus Cong Day-Nght  [Diphenhydramine-Phenylephrine] Palpitations    BP 122/82   Pulse 74   Temp 97.9 F (36.6 C) (Oral)   Ht 5' 3.5" (1.613 m)   Wt 148 lb (67.1 kg)   SpO2 98%   BMI 25.81 kg/m   Physical Exam  Constitutional: She is oriented to person, place, and time. She appears well-developed and well-nourished. No distress.  HENT:  Head: Normocephalic and atraumatic.  Right Ear: Tympanic membrane and ear canal normal.  Left Ear: Tympanic membrane and ear canal normal.  Nose: Nose normal.  Mouth/Throat: Oropharynx is clear and moist and mucous membranes are normal.  Eyes: Pupils are equal, round, and reactive to light. Conjunctivae are normal.  Neck: Neck supple. No thyromegaly present.  Cardiovascular: Normal rate, regular rhythm and intact distal pulses.  Pulmonary/Chest: Effort normal and breath sounds normal. No respiratory distress. She has no wheezes. She has no rhonchi.  Abdominal: Soft. Bowel sounds are normal. She exhibits no distension and no mass. There is abdominal tenderness (epigastric TTP).  Musculoskeletal:        General: No edema.  Lymphadenopathy:    She has no cervical adenopathy.  Neurological: She is alert and oriented to person, place, and time. She exhibits normal muscle tone. Coordination normal.  Skin: Skin is warm and dry.  Psychiatric: She has a normal mood and affect. Her behavior is normal.     A/P:  1. Annual physical exam - mammo scheduled, colonoscopy UTD, declines Dexa - declines Tdap, PCV 23; will consider shingrix and make RN appt if she would like to receive it - discussed importance of  regular CV exercise, healthy diet, adequate sleep - ALT - AST - Basic metabolic panel - Lipid panel - Urinalysis - next CPE in 1 year  2. H/O basal cell carcinoma excision - Ambulatory referral to Dermatology  3. Pneumococcal vaccination declined by patient  4. Tetanus, diphtheria, and acellular pertussis (Tdap) vaccination declined  5. Sleep disturbance - pt will try melatonin 3-5mg  qHS x 2-4 wks - reviewed sleep hygiene, handout given

## 2019-04-24 ENCOUNTER — Telehealth: Payer: Self-pay | Admitting: Gastroenterology

## 2019-04-24 ENCOUNTER — Encounter: Payer: Self-pay | Admitting: Gastroenterology

## 2019-04-24 NOTE — Telephone Encounter (Signed)
Pathology from recent upper endoscopy and colonoscopy reviewed.  Path letter mailed out to the patient with the following:  The polyps removed from your colon was benign, but precancerous, Sessile Serrated Polyps. This means that it had the potential to change into cancer over time had it not been removed.  I recommend you have a repeat colonoscopy in 5 years to determine if you have developed any new polyps and to screen for colorectal cancer.  The biopsies taken from your upper endoscopy were as follows: - The biopsies taken from your stomach were notable for gastritis (inflammation), but there was no evidence of Helicobacter pylori infection.  - The polyps resected from your stomach were benign fundic gland polyps.  There was no evidence of infection with Helicobacter pylori or intestinal metaplasia/dysplasia.  These types of polyps are typically secondary to acid suppression therapy and no specific follow-up required for these small, benign polyps. - The biopsies taken from your small intestine were normal and there was no evidence of Celiac Disease.

## 2019-04-24 NOTE — Telephone Encounter (Signed)
Please review resulted patho results and advise

## 2019-04-25 NOTE — Telephone Encounter (Signed)
This was meant for Dr. Gerrit Heck and will forward to him. Thanks.

## 2019-04-25 NOTE — Telephone Encounter (Addendum)
Called and spoke with patient-patient informed of result note and MD recommendations; patient is agreeable with plan of care; Patient verbalized understanding of information/instructions;  Patient was advised to call the office at [phone] if questions/concerns arise; Recall already in Epic;

## 2019-04-26 ENCOUNTER — Telehealth: Payer: Self-pay

## 2019-04-26 NOTE — Telephone Encounter (Signed)
Copied from [place] [id number]. Topic: General - Call Back - No Documentation >> Apr 26, 2019  3:40 PM Erick Blinks wrote: Best contact: [phone]  Reason for CRM: Pt is requesting call back from Nurse, she has information so the office can retrieve old records.

## 2019-04-27 NOTE — Telephone Encounter (Signed)
Spoke with pt. Pt gave me information to get recent mammogram and colonoscopy. Sent fax to retrieve this information

## 2019-05-04 ENCOUNTER — Encounter: Payer: Self-pay | Admitting: Family Medicine

## 2019-05-04 NOTE — Addendum Note (Signed)
Addended by: Ronnald Nian on: 05/04/2019 07:51 AM   Modules accepted: Level of Service

## 2019-05-09 ENCOUNTER — Ambulatory Visit
Admission: RE | Admit: 2019-05-09 | Discharge: 2019-05-09 | Disposition: A | Discharge status: Home / Self Care | Payer: Medicare Other | Source: Ambulatory Visit | Attending: Family Medicine | Admitting: Family Medicine

## 2019-05-09 ENCOUNTER — Other Ambulatory Visit: Payer: Self-pay

## 2019-05-09 DIAGNOSIS — Z1239 Encounter for other screening for malignant neoplasm of breast: Secondary | ICD-10-CM

## 2019-05-11 ENCOUNTER — Ambulatory Visit: Payer: Medicare Other

## 2019-05-17 ENCOUNTER — Other Ambulatory Visit: Payer: Self-pay | Admitting: Family Medicine

## 2019-05-17 DIAGNOSIS — R928 Other abnormal and inconclusive findings on diagnostic imaging of breast: Secondary | ICD-10-CM

## 2019-05-21 ENCOUNTER — Encounter: Payer: Self-pay | Admitting: Family Medicine

## 2019-06-01 ENCOUNTER — Other Ambulatory Visit: Payer: Self-pay | Admitting: Family Medicine

## 2019-06-01 ENCOUNTER — Ambulatory Visit
Admission: RE | Admit: 2019-06-01 | Discharge: 2019-06-01 | Disposition: A | Discharge status: Home / Self Care | Payer: Medicare Other | Source: Ambulatory Visit | Attending: Family Medicine | Admitting: Family Medicine

## 2019-06-01 ENCOUNTER — Other Ambulatory Visit: Payer: Self-pay

## 2019-06-01 DIAGNOSIS — N6320 Unspecified lump in the left breast, unspecified quadrant: Secondary | ICD-10-CM

## 2019-06-01 DIAGNOSIS — R928 Other abnormal and inconclusive findings on diagnostic imaging of breast: Secondary | ICD-10-CM

## 2019-06-04 ENCOUNTER — Ambulatory Visit
Admission: RE | Admit: 2019-06-04 | Discharge: 2019-06-04 | Disposition: A | Discharge status: Home / Self Care | Payer: Medicare Other | Source: Ambulatory Visit | Attending: Family Medicine | Admitting: Family Medicine

## 2019-06-04 ENCOUNTER — Other Ambulatory Visit (HOSPITAL_COMMUNITY): Payer: Self-pay | Admitting: Diagnostic Radiology

## 2019-06-04 ENCOUNTER — Other Ambulatory Visit: Payer: Self-pay

## 2019-06-04 DIAGNOSIS — N6320 Unspecified lump in the left breast, unspecified quadrant: Secondary | ICD-10-CM

## 2019-06-06 ENCOUNTER — Other Ambulatory Visit: Payer: Self-pay

## 2019-06-06 ENCOUNTER — Encounter: Payer: Self-pay | Admitting: Family Medicine

## 2019-06-06 ENCOUNTER — Telehealth (INDEPENDENT_AMBULATORY_CARE_PROVIDER_SITE_OTHER): Payer: Medicare Other | Admitting: Family Medicine

## 2019-06-06 ENCOUNTER — Telehealth: Payer: Self-pay | Admitting: Family Medicine

## 2019-06-06 DIAGNOSIS — N6320 Unspecified lump in the left breast, unspecified quadrant: Secondary | ICD-10-CM | POA: Diagnosis not present

## 2019-06-06 DIAGNOSIS — Z712 Person consulting for explanation of examination or test findings: Secondary | ICD-10-CM | POA: Diagnosis not present

## 2019-06-06 DIAGNOSIS — Z9889 Other specified postprocedural states: Secondary | ICD-10-CM | POA: Diagnosis not present

## 2019-06-06 DIAGNOSIS — R928 Other abnormal and inconclusive findings on diagnostic imaging of breast: Secondary | ICD-10-CM | POA: Diagnosis not present

## 2019-06-06 NOTE — Telephone Encounter (Signed)
Yes

## 2019-06-06 NOTE — Telephone Encounter (Signed)
Pt is coming in to get Tdap tomorrow. Please advise, is this okey?

## 2019-06-06 NOTE — Progress Notes (Signed)
Virtual Visit via Video Note  I connected with Sonya Benitez on 06/06/19 at  1:30 PM EDT by a video enabled telemedicine application and verified that I am speaking with the correct person using two identifiers. Location patient: home Location provider: home office Persons participating in the virtual visit: patient, provider  I discussed the limitations of evaluation and management by telemedicine and the availability of in person appointments. The patient expressed understanding and agreed to proceed.  Chief Complaint  Patient presents with  . discuss biopsy    HPI: Sonya Benitez is a 73 y.o. female who recently underwent biopsy of her left breast after abnormalities were found on mammogram. Lt breast US and biopsy were done on 06/04/19. Pathology showed: - FIBROCYSTIC CHANGES WITH FEATURES OF RUPTURE - THERE IS NO EVIDENCE OF MALIGNANCY  She was also found to have "probably benign calcifications" in the Rt breast and short term 73mo f/u for these with a diagnostic mammo was recommended.   Past Medical History:  Diagnosis Date  . Allergy   . Arthritis   . Cancer ([place])    skin  . Diverticulitis   . GERD (gastroesophageal reflux disease)   . Heart murmur   . Hyperlipidemia   . 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 No pertinent surgical history.  Family History  Problem Relation Age of Onset  . Arthritis Mother   . Diabetes Mother   . Heart disease Mother   . Arthritis Father   . Hearing loss Father   . Heart disease Father   . Hyperlipidemia Father   . Colon cancer Neg Hx   . Stomach cancer Neg Hx   . Rectal cancer Neg Hx   . Esophageal cancer Neg Hx     Social History   Tobacco Use  . Smoking status: Never Smoker  . Smokeless tobacco: Never Used  Substance Use Topics  . Alcohol use: Never    Frequency: Never  . Drug use: Never     Current Outpatient Medications:  .  Alpha-D-Galactosidase (BEANO ULTRA 800 PO), Take by mouth as  needed. , Disp: , Rfl:  .  NON FORMULARY, Estradiol/progesteron/testosterone Drops 150mg /3gm/50mg /42ml drop 1 drop 2 x daily, Disp: , Rfl:  .  psyllium (METAMUCIL) 58.6 % packet, Take 1 packet by mouth daily., Disp: , Rfl:  .  UNABLE TO FIND, as needed. DGL ultra , Disp: , Rfl:  .  UNABLE TO FIND, as needed. Slippery elm , Disp: , Rfl:   Allergies  Allergen Reactions  . Sudafed Pe Sinus Cong Day-Nght  [Diphenhydramine-Phenylephrine] Palpitations      ROS: See pertinent positives and negatives per HPI.   EXAM:  VITALS per patient if applicable: There were no vitals taken for this visit.   GENERAL: alert, oriented, appears well and in no acute distress  NECK: normal movements of the head and neck  LUNGS: on inspection no signs of respiratory distress, breathing rate appears normal, no obvious gross SOB, gasping or wheezing, no conversational dyspnea  CV: no obvious cyanosis  MS: moves all visible extremities without noticeable abnormality  PSYCH/NEURO: pleasant and cooperative, speech and thought processing grossly intact   ASSESSMENT AND PLAN:  1. Encounter to discuss test results 2. S/P left breast biopsy 3. Abnormal mammogram 4. Left breast mass - reviewed results with pt who already received a call this AM from breast center at [facility] - she is relieved that biopsy was negative for malignancy but is  going to f/u to have benign areas removed as recommended - Ambulatory referral to General Surgery   I discussed the assessment and treatment plan with the patient. The patient was provided an opportunity to ask questions and all were answered. The patient agreed with the plan and demonstrated an understanding of the instructions.   The patient was advised to call back or seek an in-person evaluation if the symptoms worsen or if the condition fails to improve as anticipated.   Letta Median, DO

## 2019-06-07 ENCOUNTER — Ambulatory Visit: Payer: Medicare Other

## 2019-06-07 HISTORY — PX: BREAST BIOPSY: SHX20

## 2019-06-07 NOTE — Telephone Encounter (Signed)
Pt has medicare (medicare do not pay for Tdap in office), she going to get it at the pharmacy.

## 2019-06-17 ENCOUNTER — Other Ambulatory Visit: Payer: Self-pay | Admitting: General Surgery

## 2019-06-17 DIAGNOSIS — R928 Other abnormal and inconclusive findings on diagnostic imaging of breast: Secondary | ICD-10-CM

## 2019-06-21 ENCOUNTER — Other Ambulatory Visit: Payer: Self-pay | Admitting: General Surgery

## 2019-06-21 DIAGNOSIS — R928 Other abnormal and inconclusive findings on diagnostic imaging of breast: Secondary | ICD-10-CM

## 2019-06-26 ENCOUNTER — Encounter: Payer: Self-pay | Admitting: Family Medicine

## 2019-06-27 ENCOUNTER — Other Ambulatory Visit: Payer: Self-pay

## 2019-06-27 ENCOUNTER — Encounter (HOSPITAL_BASED_OUTPATIENT_CLINIC_OR_DEPARTMENT_OTHER): Payer: Self-pay | Admitting: *Deleted

## 2019-06-27 NOTE — Progress Notes (Signed)
      Enhanced Recovery after Surgery  Enhanced Recovery after Surgery is a protocol used to improve the stress on your body and your recovery after surgery.  Patient Instructions  . The night before surgery:  o No food after midnight. ONLY clear liquids after midnight  . The day of surgery (if you do NOT have diabetes):  o Drink ONE (1) Pre-Surgery Clear Ensure as directed.   o This drink was given to you during your hospital  pre-op appointment visit. o The pre-op nurse will instruct you on the time to drink the  Pre-Surgery Ensure depending on your surgery time. o Finish the drink at the designated time by the pre-op nurse.  o Nothing else to drink after completing the  Pre-Surgery Clear Ensure.  . The day of surgery (if you have diabetes): o Drink ONE (1) Gatorade 2 (G2) as directed. o This drink was given to you during your hospital  pre-op appointment visit.  o The pre-op nurse will instruct you on the time to drink the   Gatorade 2 (G2) depending on your surgery time. o Color of the Gatorade may vary. Red is not allowed. o Nothing else to drink after completing the  Gatorade 2 (G2).         If you have questions, please contact your surgeon's office.  Surgical soap also given to patient with instructions for use.  Patient verbalized understanding of instructions. 

## 2019-06-29 ENCOUNTER — Inpatient Hospital Stay (HOSPITAL_COMMUNITY)
Admission: RE | Admit: 2019-06-29 | Discharge: 2019-06-29 | Disposition: A | Discharge status: Home / Self Care | Payer: Medicare Other | Source: Ambulatory Visit

## 2019-06-29 NOTE — H&P (Signed)
Sonya Benitez Appointment: 06/18/2019 11:30 AM Location: [facility] Patient #: [id number] DOB: November 17, 1945 Married / Language: Sonya Benitez / Race: White Female   History of Present Illness Sonya Klein MD; 06/18/2019 12:30 PM) The patient is a 73 year old female who presents with a complaint of Breast problems.Pt is a 73 yo F who is referred for an abnormal mammogram. Diagnostic imaging was performed which appeared to make the right side appear more likely to be benign, but the left side appeared more suspicious. She had asymmetry and some calcs in the "lateral retroareolar right breast." The left side had two small masses in the retroareolar region. One was more oval and 8 mm, and the other was more elongated and around 7-8 mm. Core needle biopsy was performed and showed fibrocystic changes. This was felt to be discordant and she is referred for excisional biopsy.   She had menarche at age 72, and is G91P2. She did breastfeed for 7-12 months total. She has three half siblings that have had cancer. These are on her father's side. Her father's family had no other cancer history, and the half siblings' mother's side of the family had many cancers, so that is reassuring to her. Her half siblings had breast cancer, kidney cancer, and stomach cancer (heavy smoker).    dx mammogram/us 06/01/2019 COMPARISON: Current screening study, 05/09/2019 and prior exam, 09/23/2015. No other prior studies.  ACR Breast Density Category b: There are scattered areas of fibroglandular density.  FINDINGS: On the right, the area of possible asymmetry appears as superimposed fibroglandular tissue on the diagnostic tomosynthesis images. There are several calcifications, few punctate, 3 rod-like in 1 coarse, in the lateral retroareolar breast, loosely grouped with no convincing linearity and no branching. There are no suspicious masses and no areas of architectural distortion.  On the left, there  are 2 adjacent small masses on the diagnostic tomosynthesis images, in the retroareolar left breast, the larger measuring 7 mm in long axis, oval and mostly circumscribed. There are no other masses, no areas of architectural distortion and no suspicious calcifications.  Mammographic images were processed with CAD.  On physical exam, no mass is palpated in the retroareolar regions of either breast.  Targeted right breast ultrasound is performed, showing heterogeneous tissue in the retroareolar right breast, but no mass or suspicious lesion. No dilated ducts. Ultrasound of the right axilla shows no enlarged or abnormal lymph nodes.  Targeted left breast ultrasound is performed, showing an oval mass with mildly irregular and partly ill-defined margins, hypoechoic, possibly cystic, in the 1 o'clock retroareolar left breast, with an adjacent more elongated but otherwise similar appearing smaller mass. The oval mass measures 8 x 6 x 7 mm. The more elongated mass measures 8 x 3 x 7 mm. Ultrasound of the left axilla shows no enlarged or abnormal lymph nodes.  IMPRESSION: 1. Two adjacent indeterminate masses in the retroareolar left breast, the larger measuring 8 x 6 x 7 mm. Tissue sampling of this mass is recommended. 2. Probably benign calcifications in the retroareolar right breast.  RECOMMENDATION: 1. Ultrasound-guided core needle biopsy of the larger of the 2 adjacent retroareolar left breast masses. 2. If pathology for the left breast mass is benign, then short-term follow-up for right breast calcifications with diagnostic mammography in 6 months would be recommended. If pathology demonstrates malignancy, stereotactic core needle biopsy of the right breast calcifications would be indicated.  I have discussed the findings and recommendations with the patient. If applicable, a reminder letter will  be sent to the patient regarding the next appointment.  BI-RADS CATEGORY 4:  Suspicious.   pathology left breast biopsy 06/04/2019 Diagnosis Breast, left, needle core biopsy, 1 o'clock ribbon clip - FIBROCYSTIC CHANGES WITH FEATURES OF RUPTURE. - THERE IS NO EVIDENCE OF MALIGNANCY.   Past Surgical History Sonya Klein, MD; 06/18/2019 12:28 PM) Breast Biopsy  Left. Colon Polyp Removal - Colonoscopy  Oral Surgery   Diagnostic Studies History Sonya Klein, MD; 06/18/2019 12:28 PM) Mammogram  within last year  Allergies (Sonya Benitez, Sonya Benitez; 06/18/2019 12:07 PM) No Known Drug Allergies  [06/18/2019]: Allergies Reconciled   Medication History (Sonya Benitez, CMA; 06/18/2019 12:08 PM) Estradiol-Testosterone (2-50MG /ML Oil, Intramuscular) Active. (30 ML) Medications Reconciled  Social History Sonya Klein, MD; 06/18/2019 12:28 PM) No alcohol use  No caffeine use  No drug use  Tobacco use  Never smoker.  Family History Sonya Klein, MD; 06/18/2019 12:28 PM) Arthritis  Father. Diabetes Mellitus  Mother. Heart Disease  Father. Heart disease in female family member before age 72  Migraine Headache  Daughter, Mother.  Pregnancy / Birth History Sonya Klein, MD; 06/18/2019 12:28 PM) Age at menarche  80 years. Gravida  2 Maternal age  70-25 Para  2  Other Problems Sonya Klein, MD; 06/18/2019 12:28 PM) Atrial Fibrillation  Cholelithiasis  Diverticulosis  Gastroesophageal Reflux Disease  Hemorrhoids  Hepatitis  Hypercholesterolemia  Migraine Headache     Review of Systems Sonya Klein MD; 06/18/2019 12:28 PM) HEENT Present- Wears glasses/contact lenses. Not Present- Earache, Hearing Loss, Hoarseness, Nose Bleed, Oral Ulcers, Ringing in the Ears, Seasonal Allergies, Sinus Pain, Sore Throat, Visual Disturbances and Yellow Eyes. Cardiovascular Present- Leg Cramps. Not Present- Chest Pain, Difficulty Breathing Lying Down, Palpitations, Rapid Heart Rate, Shortness of Breath and Swelling of Extremities. Female Genitourinary Not  Present- Frequency, Nocturia, Painful Urination, Pelvic Pain and Urgency. Psychiatric Not Present- Anxiety, Bipolar, Change in Sleep Pattern, Depression, Fearful and Frequent crying. Hematology Present- Easy Bruising. Not Present- Blood Thinners, Excessive bleeding, Gland problems, HIV and Persistent Infections.  Vitals (Sonya Benitez CMA; 06/18/2019 12:09 PM) 06/18/2019 12:08 PM Weight: 148.6 lb Height: 63.5in Body Surface Area: 1.71 m Body Mass Index: 25.91 kg/m  Temp.: 91.48F (Oral)  Pulse: 84 (Regular)  P.OX: 94% (Room air) BP: 128/70(Sitting, Left Arm, Standard)       Physical Exam Sonya Klein MD; 06/18/2019 12:30 PM) General Mental Status-Alert. General Appearance-Consistent with stated age. Hydration-Well hydrated. Voice-Normal.  Head and Neck Head-normocephalic, atraumatic with no lesions or palpable masses. Trachea-midline. Thyroid Gland Characteristics - normal size and consistency.  Eye Eyeball - Bilateral-Extraocular movements intact. Sclera/Conjunctiva - Bilateral-No scleral icterus.  Chest and Lung Exam Chest and lung exam reveals -quiet, even and easy respiratory effort with no use of accessory muscles and on auscultation, normal breath sounds, no adventitious sounds and normal vocal resonance. Inspection Chest Wall - Normal. Back - normal.  Breast Note: relatively symmetric bilaterally. mild ptosis. no palpable masses. no skin dimpling. no nipple retraction or nipple discharge. no LAD.   Cardiovascular Cardiovascular examination reveals -normal heart sounds, regular rate and rhythm with no murmurs and normal pedal pulses bilaterally.  Abdomen Inspection Inspection of the abdomen reveals - No Hernias. Palpation/Percussion Palpation and Percussion of the abdomen reveal - Soft, Non Tender, No Rebound tenderness, No Rigidity (guarding) and No hepatosplenomegaly. Auscultation Auscultation of the abdomen reveals - Bowel  sounds normal.  Neurologic Neurologic evaluation reveals -alert and oriented x 3 with no impairment of recent or remote memory. Mental Status-Normal.  Musculoskeletal Global Assessment -  Note: no gross deformities.  Normal Exam - Left-Upper Extremity Strength Normal and Lower Extremity Strength Normal. Normal Exam - Right-Upper Extremity Strength Normal and Lower Extremity Strength Normal.  Lymphatic Head & Neck  General Head & Neck Lymphatics: Bilateral - Description - Normal. Axillary  General Axillary Region: Bilateral - Description - Normal. Tenderness - Non Tender. Femoral & Inguinal  Generalized Femoral & Inguinal Lymphatics: Bilateral - Description - No Generalized lymphadenopathy.    Assessment & Plan Sonya Klein MD; 06/18/2019 12:30 PM)  ABNORMAL MAMMOGRAM OF LEFT BREAST (R92.8) Impression: Pt will need seed localized left breast biopsy. Due to proximity, both of the left subareolar masses can be excised at the same time. The mammographers have said that if this is malignant, it would be prudent to biopsy the right breast or do closer follow up.  The surgical procedure was described to the patient. I discussed the incision type and location and that we would need radiology involved on with a wire or seed marker and/or sentinel node.  The risks and benefits of the procedure were described to the patient and she wishes to proceed.  We discussed the risks bleeding, infection, damage to other structures, need for further procedures/surgeries. We discussed the risk of seroma. The patient was advised if the area in the breast in cancer, we may need to go back to surgery for additional tissue to obtain negative margins or for a lymph node biopsy. The patient was advised that these are the most common complications, but that others can occur as well. They were advised against taking aspirin or other anti-inflammatory agents/blood thinners the week before  surgery.  Current Plans You are being scheduled for surgery- Our schedulers will call you.  You should hear from our office's scheduling department within 5 working days about the location, date, and time of surgery. We try to make accommodations for patient's preferences in scheduling surgery, but sometimes the OR schedule or the surgeon's schedule prevents [place] from making those accommodations.  If you have not heard from our office [phone]) in 5 working days, call the office and ask for your surgeon's nurse.  If you have other questions about your diagnosis, plan, or surgery, call the office and ask for your surgeon's nurse.  Pt Education - [facility] Breast Biopsy HCI: discussed with patient and provided information.   Signed by Sonya Klein, MD (06/18/2019 12:31 PM)

## 2019-06-29 NOTE — Progress Notes (Signed)
LVM regarding the pt being too early for COVID testing to reschedule.

## 2019-06-30 ENCOUNTER — Other Ambulatory Visit (HOSPITAL_COMMUNITY)
Admission: RE | Admit: 2019-06-30 | Discharge: 2019-06-30 | Disposition: A | Discharge status: Home / Self Care | Payer: Medicare Other | Source: Ambulatory Visit | Attending: General Surgery | Admitting: General Surgery

## 2019-06-30 DIAGNOSIS — Z01812 Encounter for preprocedural laboratory examination: Secondary | ICD-10-CM | POA: Diagnosis present

## 2019-06-30 DIAGNOSIS — Z20828 Contact with and (suspected) exposure to other viral communicable diseases: Secondary | ICD-10-CM | POA: Diagnosis not present

## 2019-07-01 LAB — NOVEL CORONAVIRUS, NAA (HOSP ORDER, SEND-OUT TO REF LAB; TAT 18-24 HRS): SARS-CoV-2, NAA: NOT DETECTED

## 2019-07-03 ENCOUNTER — Ambulatory Visit
Admission: RE | Admit: 2019-07-03 | Discharge: 2019-07-03 | Disposition: A | Discharge status: Home / Self Care | Payer: Medicare Other | Source: Ambulatory Visit | Attending: General Surgery | Admitting: General Surgery

## 2019-07-03 ENCOUNTER — Other Ambulatory Visit: Payer: Self-pay

## 2019-07-03 DIAGNOSIS — R928 Other abnormal and inconclusive findings on diagnostic imaging of breast: Secondary | ICD-10-CM

## 2019-07-04 ENCOUNTER — Other Ambulatory Visit: Payer: Self-pay

## 2019-07-04 ENCOUNTER — Ambulatory Visit (HOSPITAL_BASED_OUTPATIENT_CLINIC_OR_DEPARTMENT_OTHER): Payer: Medicare Other | Admitting: Anesthesiology

## 2019-07-04 ENCOUNTER — Encounter (HOSPITAL_BASED_OUTPATIENT_CLINIC_OR_DEPARTMENT_OTHER)
Admission: RE | Disposition: A | Discharge status: Home / Self Care | Payer: Self-pay | Source: Home / Self Care | Attending: General Surgery

## 2019-07-04 ENCOUNTER — Ambulatory Visit (HOSPITAL_BASED_OUTPATIENT_CLINIC_OR_DEPARTMENT_OTHER)
Admission: RE | Admit: 2019-07-04 | Discharge: 2019-07-04 | Disposition: A | Discharge status: Home / Self Care | Payer: Medicare Other | Attending: General Surgery | Admitting: General Surgery

## 2019-07-04 ENCOUNTER — Ambulatory Visit
Admission: RE | Admit: 2019-07-04 | Discharge: 2019-07-04 | Disposition: A | Discharge status: Home / Self Care | Payer: Medicare Other | Source: Ambulatory Visit | Attending: General Surgery | Admitting: General Surgery

## 2019-07-04 ENCOUNTER — Encounter (HOSPITAL_BASED_OUTPATIENT_CLINIC_OR_DEPARTMENT_OTHER): Payer: Self-pay | Admitting: *Deleted

## 2019-07-04 DIAGNOSIS — I4891 Unspecified atrial fibrillation: Secondary | ICD-10-CM | POA: Diagnosis not present

## 2019-07-04 DIAGNOSIS — N6342 Unspecified lump in left breast, subareolar: Secondary | ICD-10-CM | POA: Insufficient documentation

## 2019-07-04 DIAGNOSIS — Z8249 Family history of ischemic heart disease and other diseases of the circulatory system: Secondary | ICD-10-CM | POA: Diagnosis not present

## 2019-07-04 DIAGNOSIS — Z800 Family history of malignant neoplasm of digestive organs: Secondary | ICD-10-CM | POA: Diagnosis not present

## 2019-07-04 DIAGNOSIS — E7800 Pure hypercholesterolemia, unspecified: Secondary | ICD-10-CM | POA: Insufficient documentation

## 2019-07-04 DIAGNOSIS — Z803 Family history of malignant neoplasm of breast: Secondary | ICD-10-CM | POA: Insufficient documentation

## 2019-07-04 DIAGNOSIS — N6453 Retraction of nipple: Secondary | ICD-10-CM | POA: Diagnosis not present

## 2019-07-04 DIAGNOSIS — Z833 Family history of diabetes mellitus: Secondary | ICD-10-CM | POA: Insufficient documentation

## 2019-07-04 DIAGNOSIS — K5790 Diverticulosis of intestine, part unspecified, without perforation or abscess without bleeding: Secondary | ICD-10-CM | POA: Insufficient documentation

## 2019-07-04 DIAGNOSIS — Z8051 Family history of malignant neoplasm of kidney: Secondary | ICD-10-CM | POA: Diagnosis not present

## 2019-07-04 DIAGNOSIS — G43909 Migraine, unspecified, not intractable, without status migrainosus: Secondary | ICD-10-CM | POA: Diagnosis not present

## 2019-07-04 DIAGNOSIS — Z820 Family history of epilepsy and other diseases of the nervous system: Secondary | ICD-10-CM | POA: Insufficient documentation

## 2019-07-04 DIAGNOSIS — Z8261 Family history of arthritis: Secondary | ICD-10-CM | POA: Diagnosis not present

## 2019-07-04 DIAGNOSIS — K219 Gastro-esophageal reflux disease without esophagitis: Secondary | ICD-10-CM | POA: Diagnosis not present

## 2019-07-04 DIAGNOSIS — Z79899 Other long term (current) drug therapy: Secondary | ICD-10-CM | POA: Diagnosis not present

## 2019-07-04 DIAGNOSIS — R928 Other abnormal and inconclusive findings on diagnostic imaging of breast: Secondary | ICD-10-CM

## 2019-07-04 DIAGNOSIS — K449 Diaphragmatic hernia without obstruction or gangrene: Secondary | ICD-10-CM | POA: Diagnosis not present

## 2019-07-04 HISTORY — PX: RADIOACTIVE SEED GUIDED EXCISIONAL BREAST BIOPSY: SHX6490

## 2019-07-04 HISTORY — PX: BREAST EXCISIONAL BIOPSY: SUR124

## 2019-07-04 SURGERY — RADIOACTIVE SEED GUIDED BREAST BIOPSY
Anesthesia: General | Site: Breast | Laterality: Left

## 2019-07-04 MED ORDER — OXYCODONE HCL 5 MG PO TABS: 2.5000 mg | ORAL_TABLET | Freq: Four times a day (QID) | ORAL | 0 refills | Status: DC | PRN

## 2019-07-04 MED ORDER — BUPIVACAINE HCL (PF) 0.5 % IJ SOLN
INTRAMUSCULAR | Status: AC
  Filled 2019-07-04: qty 30

## 2019-07-04 MED ORDER — ACETAMINOPHEN 500 MG PO TABS
ORAL_TABLET | ORAL | Status: AC
  Filled 2019-07-04: qty 2

## 2019-07-04 MED ORDER — CEFAZOLIN SODIUM-DEXTROSE 2-4 GM/100ML-% IV SOLN
2.0000 g | INTRAVENOUS | Status: AC
  Administered 2019-07-04: 2 g via INTRAVENOUS

## 2019-07-04 MED ORDER — BACITRACIN ZINC 500 UNIT/GM EX OINT
TOPICAL_OINTMENT | CUTANEOUS | Status: AC
  Filled 2019-07-04: qty 28.35

## 2019-07-04 MED ORDER — SUCCINYLCHOLINE CHLORIDE 200 MG/10ML IV SOSY
PREFILLED_SYRINGE | INTRAVENOUS | Status: AC
  Filled 2019-07-04: qty 10

## 2019-07-04 MED ORDER — LIDOCAINE HCL (PF) 1 % IJ SOLN
INTRAMUSCULAR | Status: AC
  Filled 2019-07-04: qty 30

## 2019-07-04 MED ORDER — LACTATED RINGERS IV SOLN
INTRAVENOUS | Status: DC
  Administered 2019-07-04: 08:00:00 via INTRAVENOUS

## 2019-07-04 MED ORDER — EPHEDRINE 5 MG/ML INJ
INTRAVENOUS | Status: AC
  Filled 2019-07-04: qty 10

## 2019-07-04 MED ORDER — MIDAZOLAM HCL 2 MG/2ML IJ SOLN
INTRAMUSCULAR | Status: AC
  Filled 2019-07-04: qty 2

## 2019-07-04 MED ORDER — FENTANYL CITRATE (PF) 100 MCG/2ML IJ SOLN: 25.0000 ug | INTRAMUSCULAR | Status: DC | PRN

## 2019-07-04 MED ORDER — LIDOCAINE-EPINEPHRINE (PF) 1 %-1:200000 IJ SOLN
INTRAMUSCULAR | Status: DC | PRN
  Administered 2019-07-04: 30 mL

## 2019-07-04 MED ORDER — FENTANYL CITRATE (PF) 100 MCG/2ML IJ SOLN
INTRAMUSCULAR | Status: AC
  Filled 2019-07-04: qty 2

## 2019-07-04 MED ORDER — DEXAMETHASONE SODIUM PHOSPHATE 4 MG/ML IJ SOLN
INTRAMUSCULAR | Status: DC | PRN
  Administered 2019-07-04: 5 mg via INTRAVENOUS

## 2019-07-04 MED ORDER — LIDOCAINE 2% (20 MG/ML) 5 ML SYRINGE
INTRAMUSCULAR | Status: AC
  Filled 2019-07-04: qty 5

## 2019-07-04 MED ORDER — ONDANSETRON HCL 4 MG/2ML IJ SOLN
INTRAMUSCULAR | Status: DC | PRN
  Administered 2019-07-04: 4 mg via INTRAVENOUS

## 2019-07-04 MED ORDER — CHLORHEXIDINE GLUCONATE CLOTH 2 % EX PADS: 6.0000 | MEDICATED_PAD | Freq: Once | CUTANEOUS | Status: DC

## 2019-07-04 MED ORDER — LIDOCAINE HCL (CARDIAC) PF 100 MG/5ML IV SOSY
PREFILLED_SYRINGE | INTRAVENOUS | Status: DC | PRN
  Administered 2019-07-04: 50 mg via INTRAVENOUS

## 2019-07-04 MED ORDER — DEXAMETHASONE SODIUM PHOSPHATE 10 MG/ML IJ SOLN
INTRAMUSCULAR | Status: AC
  Filled 2019-07-04: qty 1

## 2019-07-04 MED ORDER — MEPERIDINE HCL 25 MG/ML IJ SOLN: 6.2500 mg | INTRAMUSCULAR | Status: DC | PRN

## 2019-07-04 MED ORDER — CEFAZOLIN SODIUM-DEXTROSE 2-4 GM/100ML-% IV SOLN
INTRAVENOUS | Status: AC
  Filled 2019-07-04: qty 100

## 2019-07-04 MED ORDER — PROPOFOL 10 MG/ML IV BOLUS
INTRAVENOUS | Status: DC | PRN
  Administered 2019-07-04: 150 mg via INTRAVENOUS

## 2019-07-04 MED ORDER — METOCLOPRAMIDE HCL 5 MG/ML IJ SOLN: 10.0000 mg | Freq: Once | INTRAMUSCULAR | Status: DC | PRN

## 2019-07-04 MED ORDER — SUCCINYLCHOLINE CHLORIDE 20 MG/ML IJ SOLN
INTRAMUSCULAR | Status: DC | PRN
  Administered 2019-07-04: 100 mg via INTRAVENOUS

## 2019-07-04 MED ORDER — ONDANSETRON HCL 4 MG/2ML IJ SOLN
INTRAMUSCULAR | Status: AC
  Filled 2019-07-04: qty 2

## 2019-07-04 MED ORDER — PHENYLEPHRINE 40 MCG/ML (10ML) SYRINGE FOR IV PUSH (FOR BLOOD PRESSURE SUPPORT)
PREFILLED_SYRINGE | INTRAVENOUS | Status: AC
  Filled 2019-07-04: qty 10

## 2019-07-04 MED ORDER — BUPIVACAINE HCL (PF) 0.25 % IJ SOLN
INTRAMUSCULAR | Status: AC
  Filled 2019-07-04: qty 90

## 2019-07-04 MED ORDER — EPHEDRINE SULFATE 50 MG/ML IJ SOLN
INTRAMUSCULAR | Status: DC | PRN
  Administered 2019-07-04: 10 mg via INTRAVENOUS

## 2019-07-04 MED ORDER — ACETAMINOPHEN 500 MG PO TABS
1000.0000 mg | ORAL_TABLET | ORAL | Status: AC
  Administered 2019-07-04: 08:00:00 1000 mg via ORAL

## 2019-07-04 MED ORDER — FENTANYL CITRATE (PF) 100 MCG/2ML IJ SOLN
INTRAMUSCULAR | Status: DC | PRN
  Administered 2019-07-04: 50 ug via INTRAVENOUS

## 2019-07-04 SURGICAL SUPPLY — 57 items
BINDER BREAST XLRG (GAUZE/BANDAGES/DRESSINGS) ×2 IMPLANT
BLADE SURG 10 STRL SS (BLADE) ×3 IMPLANT
BLADE SURG 15 STRL LF DISP TIS (BLADE) IMPLANT
BLADE SURG 15 STRL SS (BLADE)
CANISTER SUC SOCK COL 7IN (MISCELLANEOUS) IMPLANT
CANISTER SUCT 1200ML W/VALVE (MISCELLANEOUS) ×3 IMPLANT
CHLORAPREP W/TINT 26 (MISCELLANEOUS) ×3 IMPLANT
CLIP VESOCCLUDE LG 6/CT (CLIP) ×3 IMPLANT
CLOSURE WOUND 1/2 X4 (GAUZE/BANDAGES/DRESSINGS) ×1
COVER BACK TABLE REUSABLE LG (DRAPES) ×3 IMPLANT
COVER MAYO STAND REUSABLE (DRAPES) ×3 IMPLANT
COVER PROBE W GEL 5X96 (DRAPES) ×3 IMPLANT
COVER WAND RF STERILE (DRAPES) IMPLANT
DECANTER SPIKE VIAL GLASS SM (MISCELLANEOUS) IMPLANT
DERMABOND ADVANCED (GAUZE/BANDAGES/DRESSINGS) ×2
DERMABOND ADVANCED .7 DNX12 (GAUZE/BANDAGES/DRESSINGS) ×1 IMPLANT
DRAPE LAPAROSCOPIC ABDOMINAL (DRAPES) ×3 IMPLANT
DRAPE UTILITY XL STRL (DRAPES) ×3 IMPLANT
DRSG PAD ABDOMINAL 8X10 ST (GAUZE/BANDAGES/DRESSINGS) ×2 IMPLANT
ELECT COATED BLADE 2.86 ST (ELECTRODE) ×3 IMPLANT
ELECT REM PT RETURN 9FT ADLT (ELECTROSURGICAL) ×3
ELECTRODE REM PT RTRN 9FT ADLT (ELECTROSURGICAL) ×1 IMPLANT
GAUZE SPONGE 4X4 12PLY STRL LF (GAUZE/BANDAGES/DRESSINGS) ×3 IMPLANT
GLOVE BIO SURGEON STRL SZ 6 (GLOVE) ×3 IMPLANT
GLOVE BIO SURGEON STRL SZ7 (GLOVE) ×2 IMPLANT
GLOVE BIOGEL PI IND STRL 6.5 (GLOVE) ×1 IMPLANT
GLOVE BIOGEL PI INDICATOR 6.5 (GLOVE) ×2
GLOVE EXAM NITRILE MD LF STRL (GLOVE) ×2 IMPLANT
GOWN STRL REUS W/ TWL LRG LVL3 (GOWN DISPOSABLE) ×1 IMPLANT
GOWN STRL REUS W/ TWL XL LVL3 (GOWN DISPOSABLE) IMPLANT
GOWN STRL REUS W/TWL 2XL LVL3 (GOWN DISPOSABLE) ×3 IMPLANT
GOWN STRL REUS W/TWL LRG LVL3 (GOWN DISPOSABLE) ×2
GOWN STRL REUS W/TWL XL LVL3 (GOWN DISPOSABLE) ×2
KIT MARKER MARGIN INK (KITS) ×3 IMPLANT
LIGHT WAVEGUIDE WIDE FLAT (MISCELLANEOUS) IMPLANT
NDL HYPO 25X1 1.5 SAFETY (NEEDLE) ×1 IMPLANT
NEEDLE HYPO 25X1 1.5 SAFETY (NEEDLE) ×3 IMPLANT
NS IRRIG 1000ML POUR BTL (IV SOLUTION) ×3 IMPLANT
PACK BASIN DAY SURGERY FS (CUSTOM PROCEDURE TRAY) ×3 IMPLANT
PENCIL BUTTON HOLSTER BLD 10FT (ELECTRODE) ×3 IMPLANT
SLEEVE SCD COMPRESS KNEE MED (MISCELLANEOUS) ×3 IMPLANT
SPONGE LAP 18X18 RF (DISPOSABLE) ×3 IMPLANT
STAPLER VISISTAT 35W (STAPLE) IMPLANT
STRIP CLOSURE SKIN 1/2X4 (GAUZE/BANDAGES/DRESSINGS) ×2 IMPLANT
SUT MON AB 4-0 PC3 18 (SUTURE) ×3 IMPLANT
SUT SILK 2 0 SH (SUTURE) IMPLANT
SUT VIC AB 2-0 SH 18 (SUTURE) IMPLANT
SUT VIC AB 3-0 SH 27 (SUTURE) ×2
SUT VIC AB 3-0 SH 27X BRD (SUTURE) ×1 IMPLANT
SUT VICRYL 3-0 CR8 SH (SUTURE) IMPLANT
SYR BULB 3OZ (MISCELLANEOUS) ×3 IMPLANT
SYR CONTROL 10ML LL (SYRINGE) ×3 IMPLANT
TOWEL GREEN STERILE FF (TOWEL DISPOSABLE) ×3 IMPLANT
TRAY FAXITRON CT DISP (TRAY / TRAY PROCEDURE) ×3 IMPLANT
TUBE CONNECTING 20'X1/4 (TUBING) ×1
TUBE CONNECTING 20X1/4 (TUBING) ×2 IMPLANT
YANKAUER SUCT BULB TIP NO VENT (SUCTIONS) ×3 IMPLANT

## 2019-07-04 NOTE — Discharge Instructions (Addendum)
[place] Office Phone Number [phone]  BREAST BIOPSY/ PARTIAL MASTECTOMY: POST OP INSTRUCTIONS  Always review your discharge instruction sheet given to you by the facility where your surgery was performed.  IF YOU HAVE DISABILITY OR FAMILY LEAVE FORMS, YOU MUST BRING THEM TO THE OFFICE FOR PROCESSING.  DO NOT GIVE THEM TO YOUR DOCTOR.  1. A prescription for pain medication may be given to you upon discharge.  Take your pain medication as prescribed, if needed.  If narcotic pain medicine is not needed, then you may take acetaminophen (Tylenol) or ibuprofen (Advil) as needed. 2. Take your usually prescribed medications unless otherwise directed 3. If you need a refill on your pain medication, please contact your pharmacy.  They will contact our office to request authorization.  Prescriptions will not be filled after 5pm or on week-ends. 4. You should eat very light the first 24 hours after surgery, such as soup, crackers, pudding, etc.  Resume your normal diet the day after surgery. 5. Most patients will experience some swelling and bruising in the breast.  Ice packs and a good support bra will help.  Swelling and bruising can take several days to resolve.  6. It is common to experience some constipation if taking pain medication after surgery.  Increasing fluid intake and taking a stool softener will usually help or prevent this problem from occurring.  A mild laxative (Milk of Magnesia or Miralax) should be taken according to package directions if there are no bowel movements after 48 hours. 7. Unless discharge instructions indicate otherwise, you may remove your bandages 48 hours after surgery, and you may shower at that time.  You may have steri-strips (small skin tapes) in place directly over the incision.  These strips should be left on the skin for 7-10 days.   Any sutures or staples will be removed at the office during your follow-up visit. 8. ACTIVITIES:  You may resume  regular daily activities (gradually increasing) beginning the next day.  Wearing a good support bra or sports bra (or the breast binder) minimizes pain and swelling.  You may have sexual intercourse when it is comfortable. a. You may drive when you no longer are taking prescription pain medication, you can comfortably wear a seatbelt, and you can safely maneuver your car and apply brakes. b. RETURN TO WORK:  __________1 week_______________ 9. You should see your doctor in the office for a follow-up appointment approximately two weeks after your surgery.  Your doctors nurse will typically make your follow-up appointment when she calls you with your pathology report.  Expect your pathology report 2-3 business days after your surgery.  You may call to check if you do not hear from [place] after three days.   WHEN TO CALL YOUR DOCTOR: 1. Fever over 101.0 2. Nausea and/or vomiting. 3. Extreme swelling or bruising. 4. Continued bleeding from incision. 5. Increased pain, redness, or drainage from the incision.  The clinic staff is available to answer your questions during regular business hours.  Please dont hesitate to call and ask to speak to one of the nurses for clinical concerns.  If you have a medical emergency, go to the nearest emergency room or call 911.  A surgeon from [facility] is always on call at the hospital.  For further questions, please visit [email or web address]   No tylenol until after 2:15 today     Post Anesthesia Home Care Instructions  Activity: Get plenty of rest for the remainder of the day.  A responsible individual must stay with you for 24 hours following the procedure.  For the next 24 hours, DO NOT: -Drive a car -Paediatric nurse -Drink alcoholic beverages -Take any medication unless instructed by your physician -Make any legal decisions or sign important papers.  Meals: Start with liquid foods such as gelatin or soup. Progress to regular  foods as tolerated. Avoid greasy, spicy, heavy foods. If nausea and/or vomiting occur, drink only clear liquids until the nausea and/or vomiting subsides. Call your physician if vomiting continues.  Special Instructions/Symptoms: Your throat may feel dry or sore from the anesthesia or the breathing tube placed in your throat during surgery. If this causes discomfort, gargle with warm salt water. The discomfort should disappear within 24 hours.  If you had a scopolamine patch placed behind your ear for the management of post- operative nausea and/or vomiting:  1. The medication in the patch is effective for 72 hours, after which it should be removed.  Wrap patch in a tissue and discard in the trash. Wash hands thoroughly with soap and water. 2. You may remove the patch earlier than 72 hours if you experience unpleasant side effects which may include dry mouth, dizziness or visual disturbances. 3. Avoid touching the patch. Wash your hands with soap and water after contact with the patch.

## 2019-07-04 NOTE — Transfer of Care (Signed)
Immediate Anesthesia Transfer of Care Note  Patient: Sonya Benitez  Procedure(s) Performed: RADIOACTIVE SEED GUIDED EXCISIONAL LEFT  BREAST BIOPSY (Left Breast)  Patient Location: PACU  Anesthesia Type:General  Level of Consciousness: awake, alert , oriented and drowsy  Airway & Oxygen Therapy: Patient Spontanous Breathing and Patient connected to face mask oxygen  Post-op Assessment: Report given to RN and Post -op Vital signs reviewed and stable  Post vital signs: Reviewed and stable  Last Vitals:  Vitals Value Taken Time  BP    Temp    Pulse    Resp    SpO2      Last Pain:  Vitals:   07/04/19 0802  TempSrc: Oral  PainSc: 0-No pain      Patients Stated Pain Goal: 3 ([id number])  Complications: No apparent anesthesia complications

## 2019-07-04 NOTE — Anesthesia Procedure Notes (Signed)
Procedure Name: Intubation Date/Time: 07/04/2019 9:22 AM Performed by: Willa Frater, CRNA Pre-anesthesia Checklist: Patient identified, Emergency Drugs available, Suction available and Patient being monitored Patient Re-evaluated:Patient Re-evaluated prior to induction Oxygen Delivery Method: Circle system utilized Preoxygenation: Pre-oxygenation with 100% oxygen Induction Type: IV induction, Rapid sequence and Cricoid Pressure applied Ventilation: Mask ventilation without difficulty Laryngoscope Size: Mac and 3 Grade View: Grade I Tube type: Oral Tube size: 7.0 mm Number of attempts: 1 Airway Equipment and Method: Stylet and Oral airway Placement Confirmation: ETT inserted through vocal cords under direct vision,  positive ETCO2 and breath sounds checked- equal and bilateral Secured at: 19 cm Tube secured with: Tape Dental Injury: Teeth and Oropharynx as per pre-operative assessment

## 2019-07-04 NOTE — Anesthesia Preprocedure Evaluation (Signed)
Anesthesia Evaluation  Patient identified by MRN, date of birth, ID band Patient awake    Reviewed: Allergy & Precautions, NPO status , Patient's Chart, lab work & pertinent test results  Airway Mallampati: II  TM Distance: >3 FB Neck ROM: Full    Dental no notable dental hx.    Pulmonary neg pulmonary ROS,    Pulmonary exam normal breath sounds clear to auscultation       Cardiovascular negative cardio ROS Normal cardiovascular exam Rhythm:Regular Rate:Normal     Neuro/Psych negative neurological ROS  negative psych ROS   GI/Hepatic Neg liver ROS, hiatal hernia, GERD  Poorly Controlled,  Endo/Other  negative endocrine ROS  Renal/GU negative Renal ROS  negative genitourinary   Musculoskeletal negative musculoskeletal ROS (+)   Abdominal   Peds negative pediatric ROS (+)  Hematology negative hematology ROS (+)   Anesthesia Other Findings   Reproductive/Obstetrics negative OB ROS                            Anesthesia Physical Anesthesia Plan  ASA: II  Anesthesia Plan: General   Post-op Pain Management:    Induction: Intravenous, Rapid sequence and Cricoid pressure planned  PONV Risk Score and Plan: 3 and Ondansetron, Dexamethasone and Treatment may vary due to age or medical condition  Airway Management Planned: Oral ETT  Additional Equipment:   Intra-op Plan:   Post-operative Plan: Extubation in OR  Informed Consent: I have reviewed the patients History and Physical, chart, labs and discussed the procedure including the risks, benefits and alternatives for the proposed anesthesia with the patient or authorized representative who has indicated his/her understanding and acceptance.     Dental advisory given  Plan Discussed with: CRNA  Anesthesia Plan Comments:        Anesthesia Quick Evaluation

## 2019-07-04 NOTE — Interval H&P Note (Signed)
History and Physical Interval Note:  07/04/2019 9:02 AM  Sonya Benitez  has presented today for surgery, with the diagnosis of LEFT ABNORMAL MAMMOGRAM.  The various methods of treatment have been discussed with the patient and family. After consideration of risks, benefits and other options for treatment, the patient has consented to  Procedure(s): RADIOACTIVE SEED GUIDED EXCISIONAL LEFT  BREAST BIOPSY (Left) as a surgical intervention.  The patient's history has been reviewed, patient examined, no change in status, stable for surgery.  I have reviewed the patient's chart and labs.  Questions were answered to the patient's satisfaction.     [name]

## 2019-07-04 NOTE — Anesthesia Postprocedure Evaluation (Signed)
Anesthesia Post Note  Patient: Sonya Benitez  Procedure(s) Performed: RADIOACTIVE SEED GUIDED EXCISIONAL LEFT  BREAST BIOPSY (Left Breast)     Patient location during evaluation: PACU Anesthesia Type: General Level of consciousness: awake and alert Pain management: pain level controlled Vital Signs Assessment: post-procedure vital signs reviewed and stable Respiratory status: spontaneous breathing, nonlabored ventilation, respiratory function stable and patient connected to nasal cannula oxygen Cardiovascular status: blood pressure returned to baseline and stable Postop Assessment: no apparent nausea or vomiting Anesthetic complications: no    Last Vitals:  Vitals:   07/04/19 1045 07/04/19 1124  BP: (!) 115/55 (!) 119/56  Pulse: 78 74  Resp: 13 18  Temp:  36.8 C  SpO2: 98% 100%    Last Pain:  Vitals:   07/04/19 1124  TempSrc:   PainSc: 0-No pain                 Montez Hageman

## 2019-07-04 NOTE — Op Note (Signed)
Left Breast Radioactive seed localized excisional biopsy  Indications: This patient presents with history of abnormal left mammogram with discordant core needle biopsy.  Pt also had mild left nipple retraction.    Pre-operative Diagnosis: abnormal left mammogram    Post-operative Diagnosis: abnormal left mammogram  Surgeon: [name]   Anesthesia: General endotracheal anesthesia  ASA Class: 2  Procedure Details  The patient was seen in the Holding Room. The risks, benefits, complications, treatment options, and expected outcomes were discussed with the patient. The possibilities of bleeding, infection, the need for additional procedures, failure to diagnose a condition, and creating a complication requiring transfusion or operation were discussed with the patient. The patient concurred with the proposed plan, giving informed consent.  The site of surgery properly noted/marked. The patient was taken to Operating Room # 2, identified, and the procedure verified as left Breast seed localized excisional biopsy. A Time Out was held and the above information confirmed.  The left breast and chest were prepped and draped in standard fashion. A lateral circumareolar incision was made near the previously placed radioactive seed.  Dissection was carried down to around the point of maximum signal intensity. The cautery was used to perform the dissection.   One clip was placed posteriorly The specimen was inked with the margin marker paint kit.    Specimen radiography confirmed inclusion of the mammographic lesion, the clip, and the seed.  The background signal in the breast was zero.  Hemostasis was achieved with cautery.  The wound was irrigated and closed with 3-0 vicryl interrupted deep dermal sutures and 4-0 monocryl running subcuticular suture.      Sterile dressings were applied. At the end of the operation, all sponge, instrument, and needle counts were correct.  Findings: Seed, clip in specimen.   Anterior margin is skin of nipple.    Estimated Blood Loss:  min         Specimens: left breast tissue with seed         Complications:  None; patient tolerated the procedure well.         Disposition: PACU - hemodynamically stable.         Condition: stable

## 2019-07-05 ENCOUNTER — Encounter (HOSPITAL_BASED_OUTPATIENT_CLINIC_OR_DEPARTMENT_OTHER): Payer: Self-pay | Admitting: General Surgery

## 2019-07-06 ENCOUNTER — Encounter: Payer: Self-pay | Admitting: Family Medicine

## 2019-07-06 LAB — SURGICAL PATHOLOGY

## 2019-07-06 NOTE — Progress Notes (Signed)
Please let patient know pathology is benign.

## 2019-07-10 ENCOUNTER — Encounter: Payer: Self-pay | Admitting: Family Medicine

## 2020-01-16 ENCOUNTER — Telehealth: Payer: Self-pay | Admitting: Family Medicine

## 2020-01-16 DIAGNOSIS — E7800 Pure hypercholesterolemia, unspecified: Secondary | ICD-10-CM

## 2020-01-16 NOTE — Telephone Encounter (Signed)
Patient is calling and wanted to see if an order could be put in to check her blood work. CB is [phone]

## 2020-01-16 NOTE — Telephone Encounter (Signed)
I spoke with Sonya Benitez and she informed me that she will be in town on May 28th and wanted to know if she can have yearly labs performed on that day.  She wanted to have a CPE on that day with Dr. Loletha Grayer but she will not be in office on the 28th.  Sonya Benitez also wanted to know if she could possibly get her lab work performed at office where Dr. Zigmund Daniel is currently working because her husband has an appointment with him that morning.  Please advise.

## 2020-01-17 NOTE — Telephone Encounter (Signed)
Pt informed

## 2020-01-17 NOTE — Telephone Encounter (Signed)
Patient called back and stated the the lab at [facility] stated that they use [facility] and the lab orders had to be sent to them before blood work can be done. CB is [phone]

## 2020-01-17 NOTE — Telephone Encounter (Signed)
Lab orders placed.  She can have labs done anywhere within /[place] but I would recommend calling [facility] lab to see if lab appt is required

## 2020-01-17 NOTE — Telephone Encounter (Signed)
Does patient mean she will have to go to [facility] to have labs done or the resulting agency on the lab orders needs to be [facility]? I can change the resulting agency on the orders or I guess we would print lab orders and mail them to pt or she can pick up??

## 2020-01-17 NOTE — Telephone Encounter (Signed)
Please see message and advise.  Thank you. ° °

## 2020-01-18 NOTE — Telephone Encounter (Signed)
If the orders are put in as future with resulting agency being [facility], they should be able to see and release them. I double checked with [name] about this.  Sonya Benitez

## 2020-01-18 NOTE — Telephone Encounter (Signed)
I called and spoke with pt to inform her of message.

## 2020-01-21 ENCOUNTER — Other Ambulatory Visit: Payer: Self-pay

## 2020-01-21 DIAGNOSIS — E7800 Pure hypercholesterolemia, unspecified: Secondary | ICD-10-CM

## 2020-01-21 NOTE — Addendum Note (Signed)
Addended by: Narda Rutherford on: 01/21/2020 02:42 PM   Modules accepted: Orders

## 2020-01-21 NOTE — Addendum Note (Signed)
Addended by: Narda Rutherford on: 01/21/2020 02:41 PM   Modules accepted: Orders

## 2020-02-09 LAB — LIPID PANEL
Cholesterol: 264 mg/dL — ABNORMAL HIGH (ref <200)
HDL: 56 mg/dL (ref >=50)
LDL Cholesterol (Calc): 178 mg/dL (calc) — ABNORMAL HIGH
Non-HDL Cholesterol (Calc): 208 mg/dL (calc) — ABNORMAL HIGH (ref <130)
Total CHOL/HDL Ratio: 4.7 (calc) (ref <5.0)
Triglycerides: 157 mg/dL — ABNORMAL HIGH (ref <150)

## 2020-02-09 LAB — BASIC METABOLIC PANEL
BUN: 12 mg/dL (ref 7–25)
CO2: 29 mmol/L (ref 20–32)
Calcium: 9.2 mg/dL (ref 8.6–10.4)
Chloride: 103 mmol/L (ref 98–110)
Creat: 0.74 mg/dL (ref 0.60–0.93)
Glucose, Bld: 105 mg/dL — ABNORMAL HIGH (ref 65–99)
Potassium: 4.2 mmol/L (ref 3.5–5.3)
Sodium: 140 mmol/L (ref 135–146)

## 2020-02-09 LAB — ALT: ALT: 11 U/L (ref 6–29)

## 2020-02-09 LAB — AST: AST: 16 U/L (ref 10–35)

## 2020-08-11 ENCOUNTER — Telehealth: Payer: Self-pay | Admitting: Family Medicine

## 2020-08-11 NOTE — Telephone Encounter (Signed)
Patient needs to be seen for hospital follow up. She will only be in the area on Friday, but there is nothing available with any of our providers. Please advise.   Patients contact info: [phone]

## 2020-08-11 NOTE — Telephone Encounter (Signed)
Spoke to patient and advised that there is no openings for Friday but she can call Friday morning to see if there has been any cancellations.  She was recently seen in the [place] in [place], [place] [facility] and Dx with gastroileitis was given IV fluids in the ER and told to f/u with PCP. Patient agreeable to plan.  Dm/cma

## 2020-08-14 ENCOUNTER — Telehealth: Payer: Self-pay | Admitting: Family Medicine

## 2020-08-14 ENCOUNTER — Other Ambulatory Visit: Payer: Self-pay

## 2020-08-14 NOTE — Telephone Encounter (Signed)
Patient is calling back regarding previous message. Dr. Loletha Grayer still has no availability. Please advise.

## 2020-08-14 NOTE — Telephone Encounter (Signed)
ERROR

## 2020-08-14 NOTE — Telephone Encounter (Signed)
Called patient and scheduled her an appt with [facility] @ 10:45 on Friday.  Dm/cma

## 2020-08-15 ENCOUNTER — Encounter: Payer: Self-pay | Admitting: Nurse Practitioner

## 2020-08-15 ENCOUNTER — Telehealth (INDEPENDENT_AMBULATORY_CARE_PROVIDER_SITE_OTHER): Payer: Medicare Other | Admitting: Nurse Practitioner

## 2020-08-15 VITALS — Wt 141.0 lb

## 2020-08-15 DIAGNOSIS — R1013 Epigastric pain: Secondary | ICD-10-CM | POA: Diagnosis not present

## 2020-08-15 MED ORDER — OMEPRAZOLE 40 MG PO CPDR: DELAYED_RELEASE_CAPSULE | ORAL | 1 refills | Status: AC

## 2020-08-15 NOTE — Progress Notes (Signed)
Virtual Visit via Video Note  I connected with@ on 08/15/20 at 10:15 AM EST by a video enabled telemedicine application and verified that I am speaking with the correct person using two identifiers.  Location: Patient:Home Provider: Office Participants: patient and provider  I discussed the limitations of evaluation and management by telemedicine and the availability of in person appointments. I also discussed with the patient that there may be a patient responsible charge related to this service. The patient expressed understanding and agreed to proceed.  CC:pt follow up from hospital, gastritis, pt was last seen in [place] at at [facility]   History of Present Illness: GI Problem The primary symptoms include abdominal pain, nausea and vomiting. Primary symptoms do not include diarrhea, melena, hematemesis, hematochezia, dysuria, myalgias or rash. The illness began 6 to 7 days ago. The onset was gradual. The problem has been gradually improving.  The illness is also significant for anorexia. The illness does not include chills, dysphagia, odynophagia, bloating, constipation, tenesmus, back pain or itching.  She was evaluated at ED in [facility] due to nausea, vomiting,and epigastric pain. She also reports a Hx of hiatal hernia. She states Labs, CT scan and CXR completed by [facility] ED. I do not have access to these reports at this time. She was diagnosed with gastritis per patient. She started prilosec 20mg  daily since Saturday 08/09/20 with some improvement.   Observations/Objective: Alert and oriented x 4. No acute distress. Limited exam due to virtual call.  Assessment and Plan: Dashonda was seen today for follow-up.  Diagnoses and all orders for this visit:  Dyspepsia -     omeprazole (PRILOSEC) 40 MG capsule; 1cap BID x 1week, then 1cap daily continuously   Follow Up Instructions: See above Sign medical release to get records from [facility]. Call office  if no improvement in 2weeks  I discussed the assessment and treatment plan with the patient. The patient was provided an opportunity to ask questions and all were answered. The patient agreed with the plan and demonstrated an understanding of the instructions.   The patient was advised to call back or seek an in-person evaluation if the symptoms worsen or if the condition fails to improve as anticipated.  Wilfred Lacy, NP

## 2020-08-15 NOTE — Patient Instructions (Signed)
Indigestion Indigestion is a feeling of pain, discomfort, burning, or fullness in the upper part of your abdomen. It can come and go. It may occur frequently or rarely. Indigestion tends to occur while you are eating or right after you have finished eating. It may be worse at night and while bending over or lying down. Indigestion may be a symptom of an underlying digestive condition. Follow these instructions at home: Eating and drinking   Follow an eating plan as recommended by your health care provider.  Avoid certain foods and drinks as told by your health care provider. This may include: ? Chocolate and cocoa. ? Peppermint and mint flavorings. ? Garlic and onions. ? Horseradish. ? Spicy and acidic foods, including peppers, chili powder, curry powder, vinegar, hot sauces, and barbecue sauce. ? Citrus fruits, such as oranges, lemons, and limes. ? Tomato-based foods, such as red sauce, chili, salsa, and pizza with red sauce. ? Fried and fatty foods, such as donuts, french fries, potato chips, and high-fat dressings. ? High-fat meats, such as hot dogs and fatty cuts of red and white meats, such as rib eye steak, sausage, ham, and bacon. ? High-fat dairy items, such as whole milk, butter, and cream cheese. ? Coffee and tea (with or without caffeine). ? Drinks that contain alcohol. ? Energy drinks and sports drinks. ? Carbonated drinks or sodas. ? Citrus fruit juices.  Eat small, frequent meals instead of large meals.  Avoid drinking large amounts of liquid with your meals.  Avoid eating meals during the 2-3 hours before bedtime.  Avoid lying down right after you eat.  Avoid exercise for 2 hours after you eat. Lifestyle      Maintain a healthy weight. Ask your health care provider what weight is healthy for you. If you need to lose weight, work with your health care provider to do so safely.  Exercise for at least 30 minutes on 5 or more days each week, or as told by your  health care provider. Avoid exercises that include bending forward. This can make your symptoms worse.  Wear loose-fitting clothing. Do not wear anything tight around your waist that causes pressure on your abdomen.  Do not use any products that contain nicotine or tobacco, such as cigarettes, e-cigarettes, and chewing tobacco. These can make symptoms worse. If you need help quitting, ask your health care provider.  Raise (elevate) the head of your bed about 6 inches (15 cm) when you sleep.  Try to reduce your stress, such as with yoga or meditation. If you need help reducing stress, ask your health care provider. General instructions  Take over-the-counter and prescription medicines only as told by your health care provider. Do not take aspirin, ibuprofen, or other NSAIDs unless your health care provider told you to do so.  Pay attention to any changes in your symptoms.  Keep all follow-up visits as told by your health care provider. This is important. Contact a health care provider if:  You have new symptoms.  You have unexplained weight loss.  You have difficulty swallowing, or it hurts to swallow.  Your symptoms do not improve with treatment.  Your symptoms last for more than 2 days.  You have a fever.  You vomit. Get help right away if:  You have pain in your arms, neck, jaw, teeth, or back.  You feel sweaty, dizzy, or light-headed.  You faint.  You have chest pain or shortness of breath.  You cannot stop vomiting, or you vomit  blood.  Your stool is bloody or black.  You have severe pain in your abdomen. These symptoms may represent a serious problem that is an emergency. Do not wait to see if the symptoms will go away. Get medical help right away. Call your local emergency services (911 in the [place] Do not drive yourself to the hospital. Summary  Indigestion is a feeling of pain, discomfort, burning, or fullness in the upper part of your abdomen. It tends to  occur while you are eating or right after you have finished eating.  Follow an eating plan and other lifestyle changes as told by your health care provider.  Take over-the-counter and prescription medicines only as told by your health care provider. Do not take aspirin, ibuprofen, or other NSAIDs unless your health care provider told you to do so.  Contact your health care provider if your symptoms do not get better or they get worse. Some symptoms may represent a serious problem that is an emergency. Do not wait to see if the symptoms will go away. Get medical help right away. This information is not intended to replace advice given to you by your health care provider. Make sure you discuss any questions you have with your health care provider. Document Revised: 01/30/2018 Document Reviewed: 01/30/2018 Elsevier Patient Education  [place].

## 2020-08-21 ENCOUNTER — Telehealth: Payer: Self-pay | Admitting: Family Medicine

## 2020-08-21 NOTE — Telephone Encounter (Signed)
Patient notified VIA phone that we haven't received it as of yet.  She will try to go by and get it and then possible fax it to [place] once she gets it.  Dm/cma

## 2020-08-21 NOTE — Telephone Encounter (Signed)
Pt called and was wondering if we had received any documents for Ct scan from [place]. Please advise

## 2020-08-21 NOTE — Telephone Encounter (Signed)
error 

## 2020-09-15 ENCOUNTER — Telehealth (INDEPENDENT_AMBULATORY_CARE_PROVIDER_SITE_OTHER): Payer: Medicare Other | Admitting: Family

## 2020-09-15 ENCOUNTER — Encounter: Payer: Self-pay | Admitting: Family

## 2020-09-15 VITALS — Ht 63.0 in

## 2020-09-15 DIAGNOSIS — R198 Other specified symptoms and signs involving the digestive system and abdomen: Secondary | ICD-10-CM

## 2020-09-15 DIAGNOSIS — R1010 Upper abdominal pain, unspecified: Secondary | ICD-10-CM

## 2020-09-15 NOTE — Progress Notes (Signed)
   Virtual Visit via Video   I connected with patient on 09/15/20 at  1:20 PM EST by a video enabled telemedicine application and verified that I am speaking with the correct person using two identifiers.  Location patient: Home Location provider: [employer], Office Persons participating in the virtual visit: Patient, Provider, CMA   I discussed the limitations of evaluation and management by telemedicine and the availability of in person appointments. The patient expressed understanding and agreed to proceed.  Subjective:   HPI:   75 year old female requests a video visit with concerns of epigastric pain and fullness that began around Thanksgiving. She as seen at the ER in [place], [place] and had CT scans, labs, and UA all performed that were all negative. She had a follow-up with our office and was started on Omeprazole 40 mg that helped but she has since stopped taking it. Reports her daughter having similar symptoms and was prescribed an antibiotic that cleared her. She has not seen a GI doctor in several years. No history of known h. pylori  ROS:   See pertinent positives and negatives per HPI.  Patient Active Problem List   Diagnosis Date Noted  . 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 Smoking status: Never Smoker  . Smokeless tobacco: Never Used  Substance Use Topics  . Alcohol use: Never    Current Outpatient Medications:  .  Alpha-D-Galactosidase (BEANO ULTRA 800 PO), Take by mouth as needed. , Disp: , Rfl:  .  NON FORMULARY, Estradiol/progesteron/testosterone Drops 150mg /3gm/50mg /36ml drop 1 drop 2 x daily, Disp: , Rfl:  .  omeprazole (PRILOSEC) 40 MG capsule, 1cap BID x 1week, then 1cap daily continuously, Disp: 60 capsule, Rfl: 1 .  psyllium (METAMUCIL) 58.6 % packet, Take 1 packet by mouth daily., Disp: , Rfl:   Allergies  Allergen Reactions  . Sudafed Pe Sinus Cong Day-Nght  [Diphenhydramine-Phenylephrine]  Palpitations  . Adhesive [Tape]     bandaids cause irritation    Objective:   Ht 5\' 3"  (1.6 m)   BMI 24.98 kg/m   Patient is well-developed, well-nourished in no acute distress.  Resting comfortably at home.  Head is normocephalic, atraumatic.  No labored breathing.  Speech is clear and coherent with logical content.  Patient is alert and oriented at baseline.    Assessment and Plan:    Jillien was seen today for gastroesophageal reflux.  Diagnoses and all orders for this visit:  Abdominal fullness -     Ambulatory referral to Gastroenterology  Pain of upper abdomen -     Ambulatory referral to Gastroenterology  Refer to GI doctor in [place], Okey Dupre for further management. Resume Omeprazole. Call with questions or concerns.    [place], FNP 09/15/2020

## 2020-10-14 ENCOUNTER — Other Ambulatory Visit: Payer: Self-pay | Admitting: Family Medicine

## 2020-10-14 DIAGNOSIS — R921 Mammographic calcification found on diagnostic imaging of breast: Secondary | ICD-10-CM

## 2020-10-27 ENCOUNTER — Other Ambulatory Visit: Payer: Self-pay

## 2020-10-27 ENCOUNTER — Ambulatory Visit
Admission: RE | Admit: 2020-10-27 | Discharge: 2020-10-27 | Disposition: A | Discharge status: Home / Self Care | Payer: Medicare Other | Source: Ambulatory Visit | Attending: Family Medicine | Admitting: Family Medicine

## 2020-10-27 DIAGNOSIS — R921 Mammographic calcification found on diagnostic imaging of breast: Secondary | ICD-10-CM

## 2021-08-26 IMAGING — MG MM PLC BREAST LOC DEV 1ST LESION INC MAMMO GUIDE*L*
8 of 9 series · 8 of 9 positions shown · non-contrast
Comparison: Previous exam(s).

CLINICAL DATA: Patient presents for seed localization prior to
excisional biopsy of the LEFT breast. Recent ultrasound-guided core
biopsy (shows discordant fibrocystic changes.

EXAM:
MAMMOGRAPHIC GUIDED RADIOACTIVE SEED LOCALIZATION OF THE LEFT BREAST

[L CC (1 of 3)]
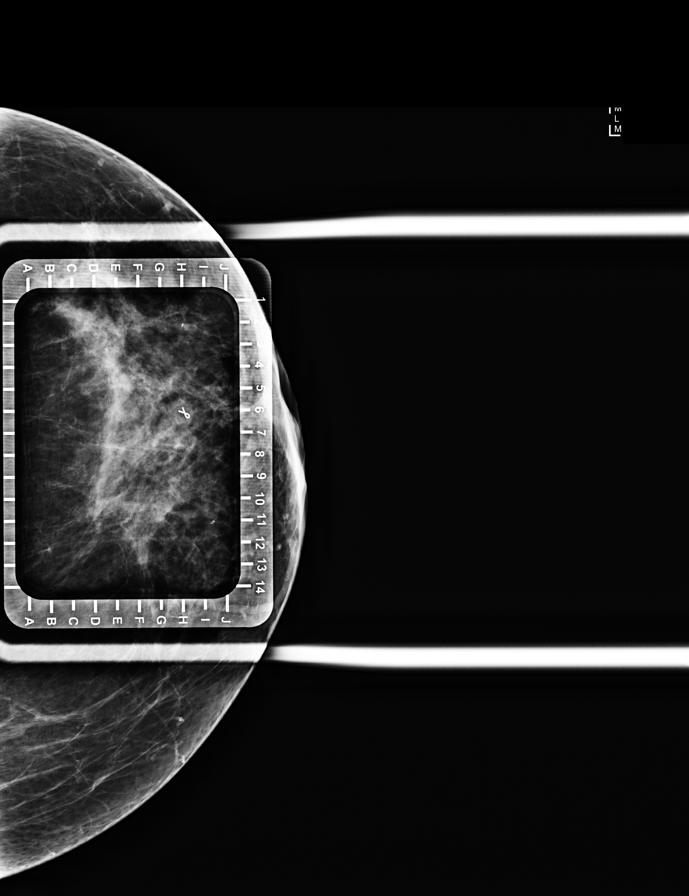

[L ML (1 of 5)]
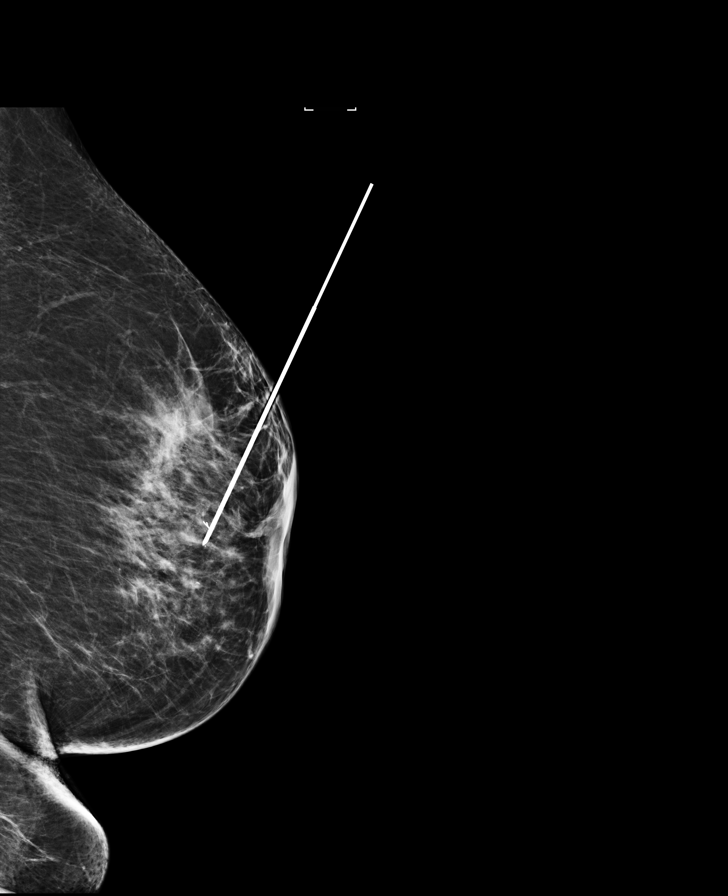

[L ML (2 of 5)]
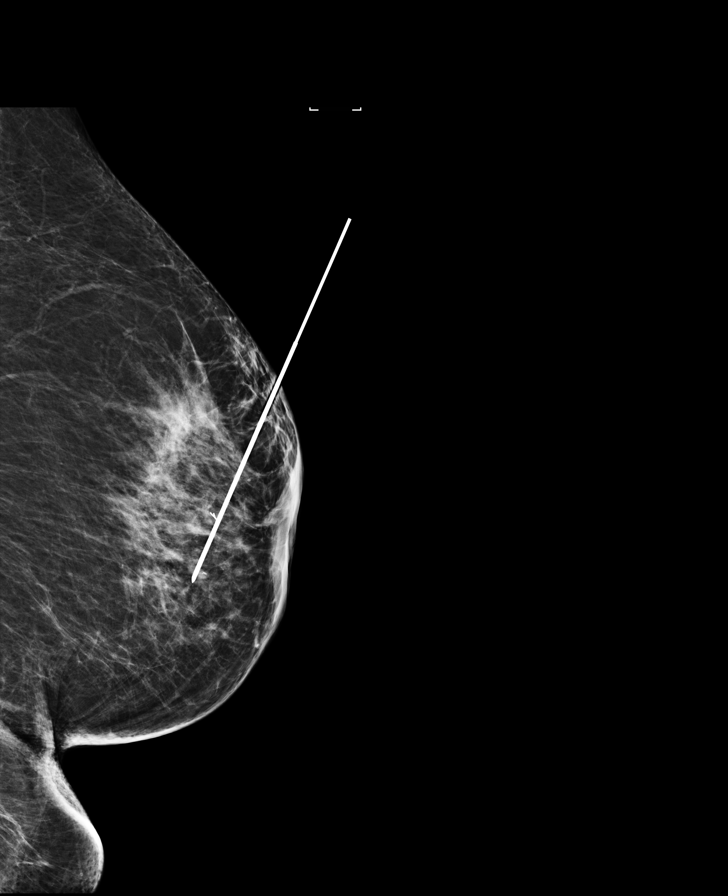

[L CC (2 of 3)]
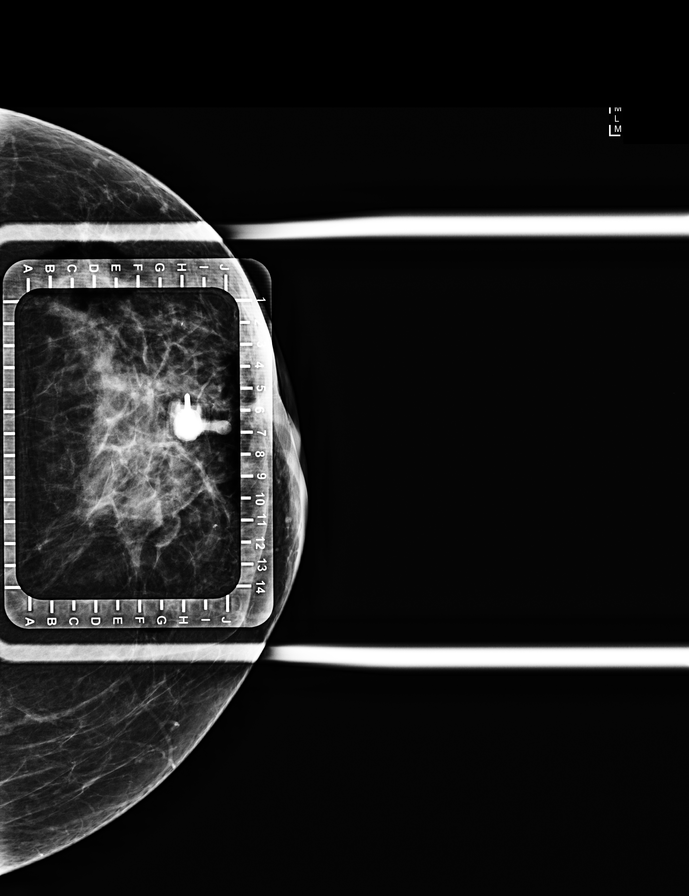

[L ML (3 of 5)]
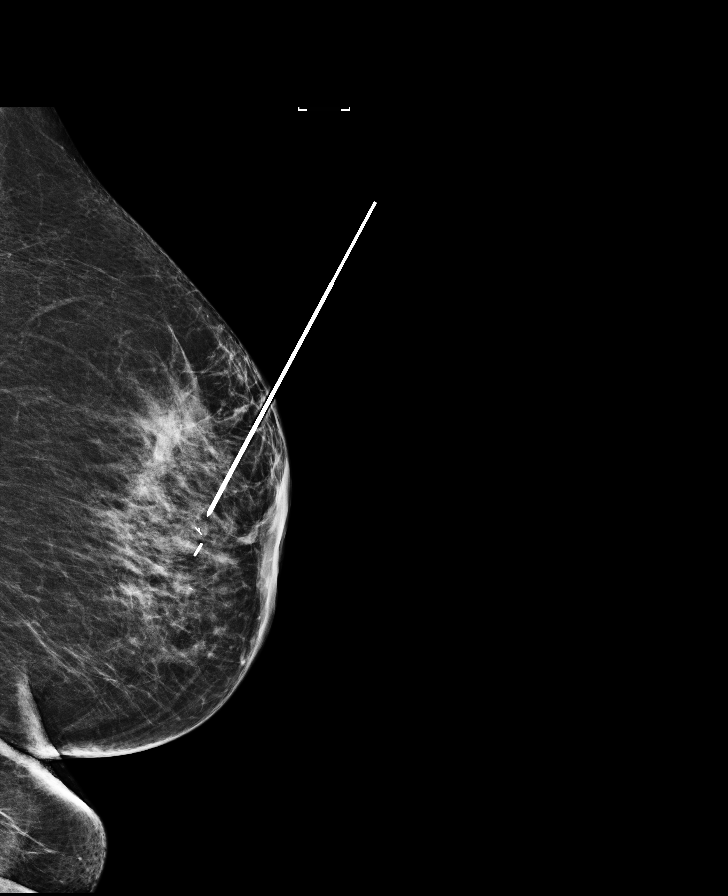

[L ML (4 of 5)]
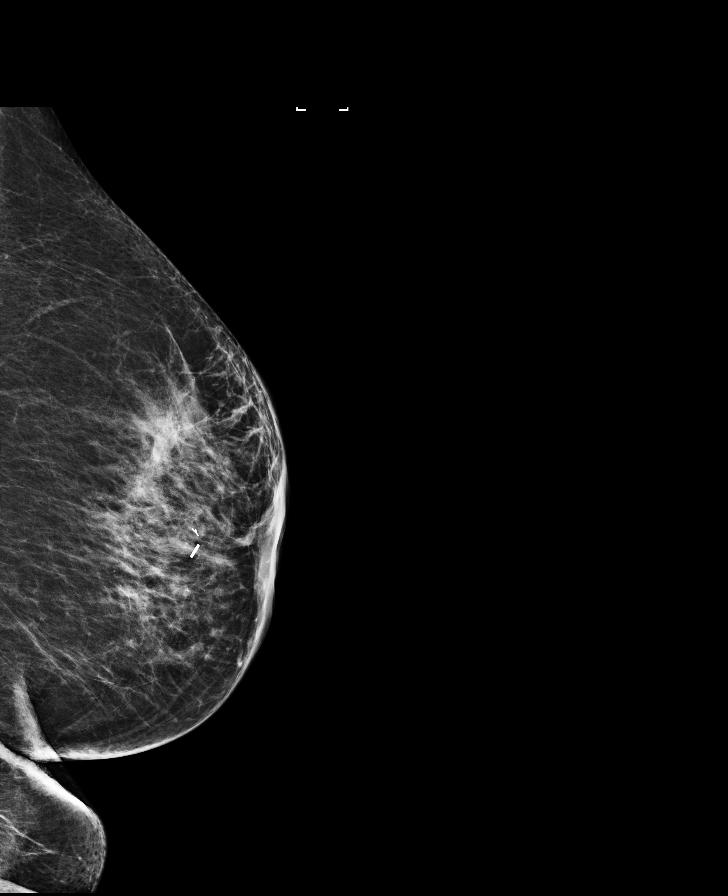

[L CC (3 of 3)]
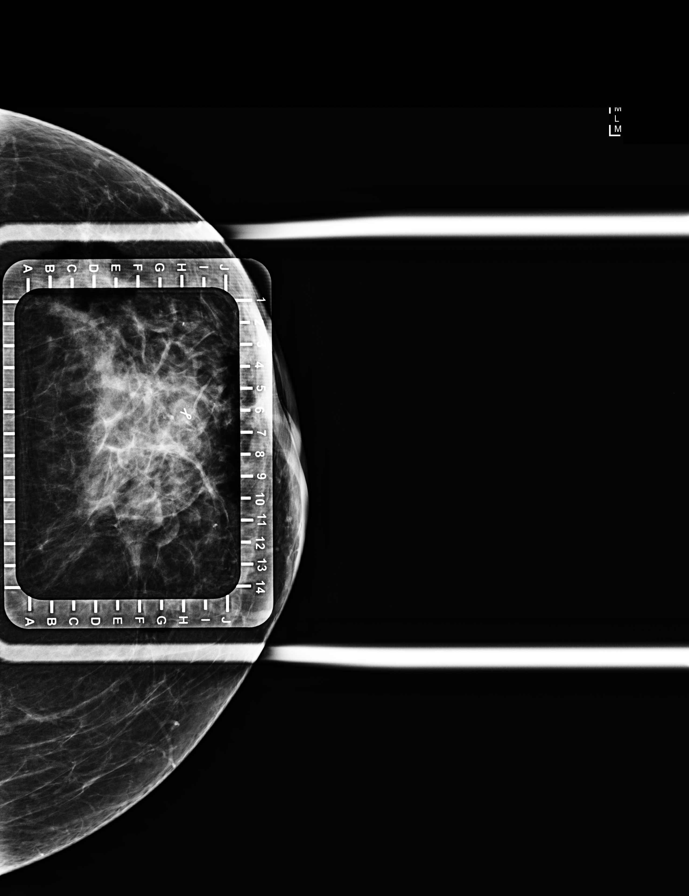

[L ML (5 of 5)]
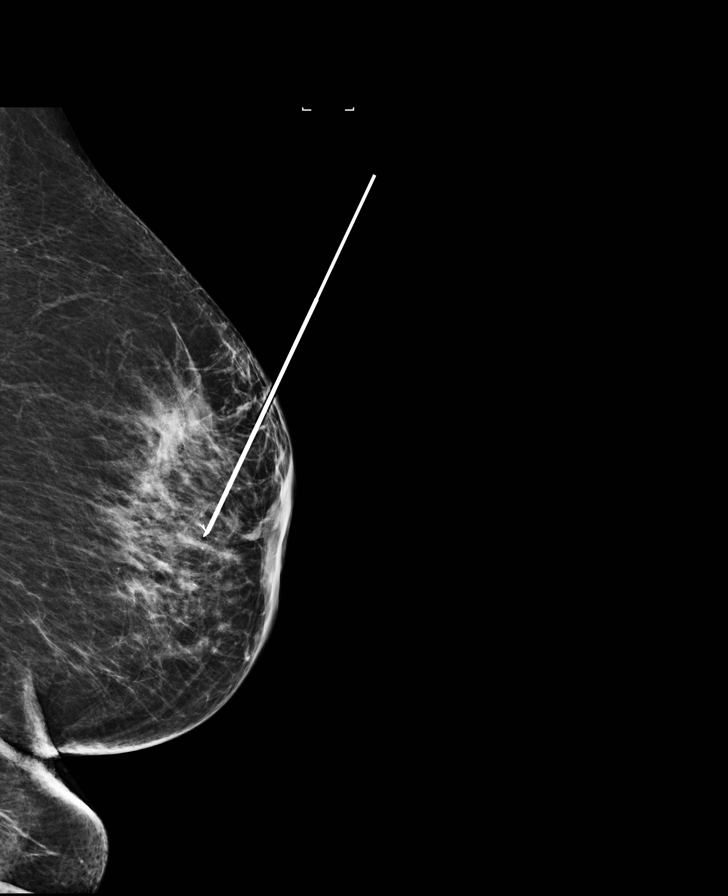

[8 of 9 positions shown; findings below may reference images not displayed]

FINDINGS: Patient presents for radioactive seed localization prior to
excision. I met with the patient and we discussed the procedure of
seed localization including benefits and alternatives. We discussed
the high likelihood of a successful procedure. We discussed the
risks of the procedure including infection, bleeding, tissue injury
and further surgery. We discussed the low dose of radioactivity
involved in the procedure. Informed, written consent was given.

The usual time-out protocol was performed immediately prior to the
procedure.

Using mammographic guidance, sterile technique, 1% lidocaine and an
T-EYL radioactive seed, the ribbon shaped clip in the anterior
portion of the LEFT breast was localized using a craniocaudal
approach. The follow-up mammogram images confirm the seed in the
expected location and were marked for Dr. Oma Oma.

Follow-up survey of the patient confirms presence of the radioactive
seed.

Order number of T-EYL seed:  [id number].

Total activity: 0.255 millicuries reference Date: 07/02/2019

The patient tolerated the procedure well and was released from the
[REDACTED]. She was given instructions regarding seed removal.
IMPRESSION: Radioactive seed localization left breast. No apparent
complications.
